# Patient Record
Sex: Female | Born: 1980 | Race: White | Hispanic: No | Marital: Married | Smoking: Never smoker
Health system: Southern US, Community
[De-identification: ages and names within clinical notes are randomized; demographics above are authoritative.]

## PROBLEM LIST (undated history)

## (undated) DIAGNOSIS — Z90710 Acquired absence of both cervix and uterus: Secondary | ICD-10-CM

## (undated) DIAGNOSIS — J302 Other seasonal allergic rhinitis: Secondary | ICD-10-CM

## (undated) DIAGNOSIS — Z803 Family history of malignant neoplasm of breast: Secondary | ICD-10-CM

## (undated) HISTORY — PX: TUBAL LIGATION: SHX77

## (undated) HISTORY — PX: LAPAROSCOPY: SHX197

## (undated) HISTORY — DX: Family history of malignant neoplasm of breast: Z80.3

## (undated) HISTORY — PX: BREAST EXCISIONAL BIOPSY: SUR124

---
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
## 2005-02-27 ENCOUNTER — Emergency Department: Payer: Self-pay | Admitting: Emergency Medicine

## 2005-05-13 ENCOUNTER — Ambulatory Visit: Payer: Self-pay | Admitting: Unknown Physician Specialty

## 2007-01-29 ENCOUNTER — Observation Stay: Payer: Self-pay

## 2007-06-06 ENCOUNTER — Observation Stay: Payer: Self-pay | Admitting: Unknown Physician Specialty

## 2007-06-13 ENCOUNTER — Inpatient Hospital Stay: Payer: Self-pay | Admitting: Unknown Physician Specialty

## 2008-02-17 ENCOUNTER — Emergency Department: Payer: Self-pay | Admitting: Emergency Medicine

## 2009-07-31 ENCOUNTER — Ambulatory Visit: Payer: Self-pay | Admitting: Unknown Physician Specialty

## 2011-12-21 HISTORY — PX: OTHER SURGICAL HISTORY: SHX169

## 2012-03-21 ENCOUNTER — Emergency Department: Payer: Self-pay | Admitting: Emergency Medicine

## 2012-03-21 LAB — CBC
HCT: 43.9 % (ref 35.0–47.0)
MCH: 31.7 pg (ref 26.0–34.0)
MCV: 93 fL (ref 80–100)
Platelet: 175 10*3/uL (ref 150–440)
RDW: 12.4 % (ref 11.5–14.5)
WBC: 9.4 10*3/uL (ref 3.6–11.0)

## 2012-03-21 LAB — COMPREHENSIVE METABOLIC PANEL
Alkaline Phosphatase: 58 U/L (ref 50–136)
Anion Gap: 9 (ref 7–16)
Creatinine: 0.79 mg/dL (ref 0.60–1.30)
EGFR (African American): >60
SGOT(AST): 16 U/L (ref 15–37)
SGPT (ALT): 16 U/L
Sodium: 141 mmol/L (ref 136–145)
Total Protein: 7.9 g/dL (ref 6.4–8.2)

## 2012-03-22 LAB — URINALYSIS, COMPLETE
Bilirubin,UR: NEGATIVE
Blood: NEGATIVE
Leukocyte Esterase: NEGATIVE
Nitrite: NEGATIVE
Ph: 5 (ref 4.5–8.0)
Specific Gravity: 1.027 (ref 1.003–1.030)
Squamous Epithelial: 2
WBC UR: 1 /HPF (ref 0–5)

## 2012-03-22 LAB — PREGNANCY, URINE: Pregnancy Test, Urine: NEGATIVE m[IU]/mL

## 2013-03-12 ENCOUNTER — Ambulatory Visit: Payer: Self-pay | Admitting: Obstetrics and Gynecology

## 2013-03-12 LAB — CBC WITH DIFFERENTIAL/PLATELET
Basophil #: 0 10*3/uL (ref 0.0–0.1)
Eosinophil #: 0 10*3/uL (ref 0.0–0.7)
HGB: 11.5 g/dL — ABNORMAL LOW (ref 12.0–16.0)
Lymphocyte #: 1.4 10*3/uL (ref 1.0–3.6)
Lymphocyte %: 16.1 %
MCH: 28 pg (ref 26.0–34.0)
MCHC: 33.3 g/dL (ref 32.0–36.0)
Monocyte #: 0.8 x10 3/mm (ref 0.2–0.9)
Monocyte %: 8.6 %
Neutrophil #: 6.6 10*3/uL — ABNORMAL HIGH (ref 1.4–6.5)
Neutrophil %: 74.5 %
Platelet: 209 10*3/uL (ref 150–440)
WBC: 8.9 10*3/uL (ref 3.6–11.0)

## 2013-03-13 ENCOUNTER — Inpatient Hospital Stay: Payer: Self-pay | Admitting: Obstetrics and Gynecology

## 2013-03-14 LAB — HEMATOCRIT: HCT: 31.4 % — ABNORMAL LOW (ref 35.0–47.0)

## 2014-01-30 ENCOUNTER — Emergency Department: Payer: Self-pay | Admitting: Internal Medicine

## 2014-01-30 LAB — COMPREHENSIVE METABOLIC PANEL
ALK PHOS: 57 U/L
AST: 11 U/L — AB (ref 15–37)
Albumin: 3.5 g/dL (ref 3.4–5.0)
Anion Gap: 2 — ABNORMAL LOW (ref 7–16)
BUN: 11 mg/dL (ref 7–18)
Bilirubin,Total: 0.8 mg/dL (ref 0.2–1.0)
CO2: 26 mmol/L (ref 21–32)
Calcium, Total: 8.5 mg/dL (ref 8.5–10.1)
Chloride: 108 mmol/L — ABNORMAL HIGH (ref 98–107)
Creatinine: 0.72 mg/dL (ref 0.60–1.30)
EGFR (Non-African Amer.): >60
Glucose: 117 mg/dL — ABNORMAL HIGH (ref 65–99)
OSMOLALITY: 272 (ref 275–301)
Potassium: 3.6 mmol/L (ref 3.5–5.1)
SGPT (ALT): 14 U/L (ref 12–78)
SODIUM: 136 mmol/L (ref 136–145)
Total Protein: 6.9 g/dL (ref 6.4–8.2)

## 2014-01-30 LAB — CBC
HCT: 34.6 % — ABNORMAL LOW (ref 35.0–47.0)
HGB: 11.1 g/dL — ABNORMAL LOW (ref 12.0–16.0)
MCH: 27 pg (ref 26.0–34.0)
MCHC: 32.2 g/dL (ref 32.0–36.0)
MCV: 84 fL (ref 80–100)
Platelet: 198 10*3/uL (ref 150–440)
RBC: 4.12 10*6/uL (ref 3.80–5.20)
RDW: 14.5 % (ref 11.5–14.5)
WBC: 8.1 10*3/uL (ref 3.6–11.0)

## 2014-01-30 LAB — URINALYSIS, COMPLETE
Bilirubin,UR: NEGATIVE
GLUCOSE, UR: NEGATIVE mg/dL (ref 0–75)
KETONE: NEGATIVE
NITRITE: NEGATIVE
Ph: 7 (ref 4.5–8.0)
Protein: 100
Specific Gravity: 1.014 (ref 1.003–1.030)
Squamous Epithelial: 1

## 2014-02-01 LAB — URINE CULTURE

## 2014-12-20 HISTORY — PX: ABDOMINAL HYSTERECTOMY: SHX81

## 2015-04-11 NOTE — Op Note (Signed)
PATIENT NAME:  Katie Blanchard, Katie Blanchard DATE OF BIRTH:  19-Nov-1981  DATE OF PROCEDURE:  03/13/2013  PREOPERATIVE DIAGNOSES: 1.  Intrauterine pregnancy at [redacted] weeks gestational age.  2.  History of prior cesarean section, desires repeat.  3.  Multiparity, desires permanent sterility.   POSTOPERATIVE DIAGNOSES:  1.  Intrauterine pregnancy at [redacted] weeks gestational age.  2.  History of prior cesarean section, desires repeat.  3.  Multiparity, desires permanent sterility.   PROCEDURES: 1.  Repeat low transverse cesarean section via Pfannenstiel incision.  2.  Bilateral tubal ligation via Pomeroy method.  SURGEON: Prentice Docker, MD  ASSISTANT:  Glean Salen, MD   ANESTHESIA:  Spinal.   ESTIMATED BLOOD LOSS: 750 mL.  OPERATIVE FLUIDS: 1200 mL.   COMPLICATIONS: None.   FINDINGS: Normal appearing gravid uterus, fallopian tubes, and ovaries.   SPECIMENS: None.   CONDITION: Stable at the end of the procedure.  INDICATION FOR PROCEDURE:  The patient is a 34 year old gravida 2, para 1-0-0-1 at [redacted] weeks gestational age with a history of prior cesarean section. Furthermore, she desired permanent sterilization. She was counseled regarding alternatives as well as the risks and benefits of permanent sterilization including a failure rate of approximately 4 in 1,000 and the increased risk of ectopic pregnancy should pregnancy occur after undergoing tubal sterilization. Knowing these risks, she desired to proceed with repeat cesarean section and tubal ligation. She was therefore taken to the operating room for abdominal delivery.   PROCEDURE IN DETAIL: After the patient was met in the preoperative area and the procedure was reviewed and the patient's desire for tubal ligation was once again verified, she was taken to the operating room. She was placed under  spinal anesthesia, which was found to be adequate. She was placed in the dorsal supine position with a leftward tilt and prepped and  draped in the usual sterile fashion. A Pfannenstiel incision was made and carried through the various layers until the peritoneum was identified and entered sharply. Peritoneal opening was then extended in the cranial and caudal directions. A bladder flap was created and a bladder blade was placed to pull the bladder out of the operative area of interest. A low transverse hysterotomy was made on the uterus and extended laterally with cranial and caudal tension. The amniotic sac was ruptured for clear fluid and the fetal vertex was grasped and elevated to the hysterotomy and delivered followed by the shoulders and the rest of the body without difficulty. The cord was clamped and cut and the infant was handed off to the awaiting pediatrician. The placenta was then removed and the uterus was exteriorized and cleared of all clots and debris. The hysterotomy was closed using #0 Vicryl in a running locked fashion. A second layer of the same suture was used to obtain hemostasis. Hemostasis also required an additional figure-of-eight stitch.   Attention was turned to the left fallopian tube for the ligation portion of the procedure. An approximately 3 cm segment was ligated at approximately 3 cm from the cornual region using the Pomeroy method by elevating the tubal segment with a Babcock clamp and then using 0 plain gut x 2 sutures to tie off at the base and then remove the tubal segment. Hemostasis was verified. This procedure was carried out in exactly the same manner on the right fallopian tube and hemostasis was also verified.   The uterus was returned to the abdomen and the abdomen was cleared of all clots and debris.  The peritoneum was reapproximated with a loose 0 Vicryl figure-of-eight stitch.   The On-Q pump catheters were placed, both in the midline, approximately 1 cm apart, 1 just cephalad to the first, approximately 4 cm cephalad to the incision line. They were placed at the level just superficial to the  rectus abdominis muscles and just deep to the rectus fascia. They were inserted to the level of approximately the fourth mark on the catheters.   The rest of the insertion was carried out in accordance with the manufacturer's recommendations.   The fascia was closed using #0 Maxon in a running fashion starting at both apices and meeting in the midline where they were tied off. The incision was closed using staples. The On-Q catheter pumps were affixed to the skin using Dermabond as well as Steri-Strips and Tegaderm. Prior to the end of the case, the On-Q pump was bolused with 5 mL of 0.5% Marcaine in each catheter for a total of 10 mL.   The patient tolerated the procedure well. Sponge, lap and needle counts were correct x 2. Prior to the procedure, the patient received 2 grams of Ancef for antibiotic prophylaxis. She was taken to the recovery room in stable condition.  ____________________________ Will Bonnet, MD sdj:sb D: 03/13/2013 08:49:59 ET T: 03/13/2013 09:16:26 ET JOB#: [id number]  cc: Will Bonnet, MD, <Dictator> Will Bonnet MD ELECTRONICALLY SIGNED 04/16/2013 13:17

## 2015-07-01 ENCOUNTER — Encounter: Payer: Self-pay | Admitting: *Deleted

## 2015-07-09 ENCOUNTER — Encounter: Payer: Self-pay | Admitting: General Surgery

## 2015-07-10 ENCOUNTER — Encounter: Payer: Self-pay | Admitting: General Surgery

## 2015-07-10 ENCOUNTER — Ambulatory Visit: Payer: BLUE CROSS/BLUE SHIELD

## 2015-07-10 ENCOUNTER — Ambulatory Visit (INDEPENDENT_AMBULATORY_CARE_PROVIDER_SITE_OTHER): Payer: BLUE CROSS/BLUE SHIELD | Admitting: General Surgery

## 2015-07-10 VITALS — BP 116/70 | HR 76 | Resp 12 | Ht 64.0 in | Wt 117.0 lb

## 2015-07-10 DIAGNOSIS — N6310 Unspecified lump in the right breast, unspecified quadrant: Secondary | ICD-10-CM

## 2015-07-10 DIAGNOSIS — N63 Unspecified lump in breast: Secondary | ICD-10-CM | POA: Diagnosis not present

## 2015-07-10 NOTE — Progress Notes (Addendum)
Patient ID: Katie Blanchard, female   DOB: 1981/10/10, 34 y.o.   MRN: [id number]  Chief Complaint  Patient presents with  . Other    right breast lump    HPI Katie Blanchard is a 34 y.o. female here today for a evlaution of a lump in right breast. Patient states she noticed this about 6 months ago. She states the area has got bigger. No pain but tender to touch.  HPI  No past medical history on file.  Past Surgical History  Procedure Laterality Date  . Laparoscopy  [id number]  . Toe surgery Left 2002  . Breast mass removal Left 2013    Fibroadenoma    Family History  Problem Relation Age of Onset  . Breast cancer Maternal Aunt   . Ovarian cancer Mother     Social History History  Substance Use Topics  . Smoking status: Never Smoker   . Smokeless tobacco: Not on file  . Alcohol Use: 0.0 oz/week    0 Standard drinks or equivalent per week    Allergies  Allergen Reactions  . Prednazoline Anaphylaxis    No current outpatient prescriptions on file.   No current facility-administered medications for this visit.    Review of Systems Review of Systems  Constitutional: Negative.   Respiratory: Negative.   Cardiovascular: Negative.     Blood pressure 116/70, pulse 76, resp. rate 12, height 5\' 4"  (1.626 m), weight 117 lb (53.071 kg), last menstrual period 07/10/2015.  Physical Exam Physical Exam  Constitutional: She is oriented to person, place, and time. She appears well-nourished.  Eyes: Conjunctivae are normal. No scleral icterus.  Neck: Neck supple.  Cardiovascular: Normal rate, regular rhythm and normal heart sounds.   Pulmonary/Chest: Effort normal and breath sounds normal. Right breast exhibits mass (upper inner quadrant). Right breast exhibits no inverted nipple, no nipple discharge, no skin change and no tenderness. Left breast exhibits no inverted nipple, no mass, no nipple discharge, no skin change and no tenderness.    Lymphadenopathy:    She has no  cervical adenopathy.  Neurological: She is alert and oriented to person, place, and time.  Skin: Skin is warm and dry.    Data Reviewed Ultrasound examination of the right breast in the upper inner quadrant showed a 1.0 x 1.5 x 2.0 cm slightly lobulated hypoechoic mass with posterior acoustic enhancement and sharp edge affect. The mass is consistent with a fibroadenoma. BI-RADS-2.  Assessment    The patient reports an enlarging breast mass with mild local discomfort. Excision is warranted.    Plan    The procedure was reviewed. She is undergone surgical excision in the past comfortably as an office procedure.     Patient to return for right breast office excision. This patient requests to call the office back to arrange a date once she checks her work schedule.   Robert Bellow 07/12/2015, 9:12 AM

## 2015-07-10 NOTE — Patient Instructions (Addendum)
Patient to return for right breast office excision. Please call back to arrange a date at your convenience.

## 2015-07-12 DIAGNOSIS — N6310 Unspecified lump in the right breast, unspecified quadrant: Secondary | ICD-10-CM | POA: Insufficient documentation

## 2015-08-26 ENCOUNTER — Ambulatory Visit (INDEPENDENT_AMBULATORY_CARE_PROVIDER_SITE_OTHER): Payer: BLUE CROSS/BLUE SHIELD | Admitting: General Surgery

## 2015-08-26 ENCOUNTER — Encounter: Payer: Self-pay | Admitting: General Surgery

## 2015-08-26 VITALS — BP 112/68 | HR 62 | Resp 12 | Ht 64.0 in | Wt 116.0 lb

## 2015-08-26 DIAGNOSIS — N6310 Unspecified lump in the right breast, unspecified quadrant: Secondary | ICD-10-CM

## 2015-08-26 DIAGNOSIS — N63 Unspecified lump in breast: Secondary | ICD-10-CM

## 2015-08-26 DIAGNOSIS — D241 Benign neoplasm of right breast: Secondary | ICD-10-CM

## 2015-08-26 DIAGNOSIS — D249 Benign neoplasm of unspecified breast: Secondary | ICD-10-CM | POA: Insufficient documentation

## 2015-08-26 HISTORY — PX: BREAST MASS EXCISION: SHX1267

## 2015-08-26 MED ORDER — HYDROCODONE-ACETAMINOPHEN 5-325 MG PO TABS: 1.0000 | ORAL_TABLET | Freq: Four times a day (QID) | ORAL | Status: DC | PRN

## 2015-08-26 NOTE — Progress Notes (Signed)
Patient ID: Katie Blanchard, female   DOB: May 18, 1981, 34 y.o.   MRN: [id number]  Chief Complaint  Patient presents with  . Procedure    right breast mass excision    HPI Katie Blanchard is a 34 y.o. female.  Here today for excision right breast mass.   HPI  No past medical history on file.  Past Surgical History  Procedure Laterality Date  . Laparoscopy  [id number]  . Toe surgery Left 2002  . Breast mass removal Left 2013    Fibroadenoma    Family History  Problem Relation Age of Onset  . Breast cancer Maternal Aunt   . Ovarian cancer Mother     Social History Social History  Substance Use Topics  . Smoking status: Never Smoker   . Smokeless tobacco: None  . Alcohol Use: 0.0 oz/week    0 Standard drinks or equivalent per week    Allergies  Allergen Reactions  . Prednazoline Anaphylaxis    Current Outpatient Prescriptions  Medication Sig Dispense Refill  . HYDROcodone-acetaminophen (NORCO) 5-325 MG per tablet Take 1-2 tablets by mouth every 6 (six) hours as needed for moderate pain or severe pain. 30 tablet 0   No current facility-administered medications for this visit.    Review of Systems Review of Systems  Constitutional: Negative.   Respiratory: Negative.   Cardiovascular: Negative.     Blood pressure 112/68, pulse 62, resp. rate 12, height 5\' 4"  (1.626 m), weight 116 lb (52.617 kg), last menstrual period 08/09/2015.  Physical Exam Physical Exam  Constitutional: She is oriented to person, place, and time. She appears well-developed and well-nourished.  Pulmonary/Chest:    Neurological: She is alert and oriented to person, place, and time.  Skin: Skin is warm and dry.  Psychiatric: Her behavior is normal.      Assessment    Right breast fibroadenoma.    Plan    The procedure for excision was reviewed.  The area was marked and alcohol applied to the skin. 20 mL of 0.5% Xylocaine with 0.25% Marcaine with 1-200,000 units of epinephrine was  utilized well tolerated. Chlor prep was applied to the skin. A curvilinear incision in the upper inner quadrant of the breast over the mass was carried down through skin and subcutaneous this tissue with hemostasis achieved by 3-0 Vicryls suture ligature. The mass was excised and oriented with a short suture medially, long suture superiorly. The breast parenchyma was reapproximated with a 3-0 Vicryls figure-of-eight suture. The skin was closed with a running 3-0 Vicryls septic suture. Benzoin, Steri-Strips, Telfa and Tegaderm dressing applied.  Ice pack provided. Postoperative wound care reviewed with the patient by the nurse.  Follow-up examination in one week. She will be contacted when pathology is available.     PCP:  No Pcp   Robert Bellow 08/26/2015, 1:21 PM

## 2015-08-26 NOTE — Patient Instructions (Signed)

## 2015-08-27 ENCOUNTER — Telehealth: Payer: Self-pay | Admitting: General Surgery

## 2015-08-27 LAB — PATHOLOGY

## 2015-08-27 NOTE — Telephone Encounter (Signed)
The patient was notified that the pathology was as anticipated, fibroadenoma. She reports being sore but otherwise doing well. Follow up next week as planned.

## 2015-09-03 ENCOUNTER — Encounter: Payer: Self-pay | Admitting: General Surgery

## 2015-09-03 ENCOUNTER — Ambulatory Visit (INDEPENDENT_AMBULATORY_CARE_PROVIDER_SITE_OTHER): Payer: BLUE CROSS/BLUE SHIELD | Admitting: General Surgery

## 2015-09-03 VITALS — BP 116/68 | HR 68 | Resp 12 | Ht 64.0 in | Wt 116.0 lb

## 2015-09-03 DIAGNOSIS — D241 Benign neoplasm of right breast: Secondary | ICD-10-CM

## 2015-09-03 NOTE — Patient Instructions (Signed)
Call with any concerns or questions.

## 2015-09-03 NOTE — Progress Notes (Signed)
Patient ID: Katie Blanchard, female   DOB: 01/12/1981, 34 y.o.   MRN: [id number]  Chief Complaint  Patient presents with  . Routine Post Op    HPI Katie Blanchard is a 34 y.o. female.  Here today for postoperative visit, right breast mass excision, fibroadenoma. She states she is doing well.   HPI  No past medical history on file.  Past Surgical History  Procedure Laterality Date  . Laparoscopy  [id number]  . Toe surgery Left 2002  . Breast mass removal Left 2013    Fibroadenoma  . Breast mass excision Right 08-26-15    fibroadenoma    Family History  Problem Relation Age of Onset  . Breast cancer Maternal Aunt   . Ovarian cancer Mother     Social History Social History  Substance Use Topics  . Smoking status: Never Smoker   . Smokeless tobacco: None  . Alcohol Use: 0.0 oz/week    0 Standard drinks or equivalent per week    Allergies  Allergen Reactions  . Prednazoline Anaphylaxis    prednisone    No current outpatient prescriptions on file.   No current facility-administered medications for this visit.    Review of Systems Review of Systems  Constitutional: Negative.   Respiratory: Negative.   Cardiovascular: Negative.     Blood pressure 116/68, pulse 68, resp. rate 12, height 5\' 4"  (1.626 m), weight 116 lb (52.617 kg), last menstrual period 08/29/2015.  Physical Exam Physical Exam  Constitutional: She is oriented to person, place, and time. She appears well-developed and well-nourished.  Eyes: No scleral icterus.  Pulmonary/Chest:    Well healed incision in right breast   Lymphadenopathy:    She has no cervical adenopathy.  Neurological: She is alert and oriented to person, place, and time.  Skin: Skin is warm and dry.  Psychiatric: Her behavior is normal.    Data Reviewed Pathology confirmed the clinical impression of a fibroadenoma.  Assessment    Doing well status post fibroadenoma excision.    Plan    The patient will continue her  own monthly self examination. Screening mammogram at age 89.     Call with any concerns or questions.   PCP:  No Pcp   Robert Bellow 09/04/2015, 7:50 AM

## 2015-10-15 LAB — HM PAP SMEAR: HM Pap smear: NEGATIVE

## 2015-11-26 ENCOUNTER — Encounter
Admission: RE | Admit: 2015-11-26 | Discharge: 2015-11-26 | Disposition: A | Discharge status: Home / Self Care | Payer: BLUE CROSS/BLUE SHIELD | Source: Ambulatory Visit | Attending: Obstetrics and Gynecology | Admitting: Obstetrics and Gynecology

## 2015-11-26 DIAGNOSIS — Z01812 Encounter for preprocedural laboratory examination: Secondary | ICD-10-CM | POA: Insufficient documentation

## 2015-11-26 LAB — TYPE AND SCREEN
ABO/RH(D): O POS
ANTIBODY SCREEN: NEGATIVE

## 2015-11-26 LAB — CBC
HCT: 35.9 % (ref 35.0–47.0)
Hemoglobin: 11.2 g/dL — ABNORMAL LOW (ref 12.0–16.0)
MCH: 25.2 pg — AB (ref 26.0–34.0)
MCHC: 31.2 g/dL — ABNORMAL LOW (ref 32.0–36.0)
MCV: 80.9 fL (ref 80.0–100.0)
PLATELETS: 225 10*3/uL (ref 150–440)
RBC: 4.43 MIL/uL (ref 3.80–5.20)
RDW: 14.6 % — ABNORMAL HIGH (ref 11.5–14.5)
WBC: 3.9 10*3/uL (ref 3.6–11.0)

## 2015-11-26 LAB — COMPREHENSIVE METABOLIC PANEL
ALT: 14 U/L (ref 14–54)
AST: 15 U/L (ref 15–41)
Albumin: 4.4 g/dL (ref 3.5–5.0)
Alkaline Phosphatase: 40 U/L (ref 38–126)
Anion gap: 3 — ABNORMAL LOW (ref 5–15)
BUN: 14 mg/dL (ref 6–20)
CHLORIDE: 109 mmol/L (ref 101–111)
CO2: 27 mmol/L (ref 22–32)
CREATININE: 0.6 mg/dL (ref 0.44–1.00)
Calcium: 9.1 mg/dL (ref 8.9–10.3)
GFR calc non Af Amer: >60 mL/min (ref >60)
Glucose, Bld: 83 mg/dL (ref 65–99)
Potassium: 3.9 mmol/L (ref 3.5–5.1)
SODIUM: 139 mmol/L (ref 135–145)
Total Bilirubin: 0.9 mg/dL (ref 0.3–1.2)
Total Protein: 7.2 g/dL (ref 6.5–8.1)

## 2015-11-26 LAB — ABO/RH: ABO/RH(D): O POS

## 2015-11-26 NOTE — Patient Instructions (Signed)
  Your procedure is scheduled on: 12/02/15 Report to Day Surgery. [facility]OND FLOOR To find out your arrival time please call [phone] between 1PM - 3PM on 12/01/15.  Remember: Instructions that are not followed completely may result in serious medical risk, up to and including death, or upon the discretion of your surgeon and anesthesiologist your surgery may need to be rescheduled.    _X___ 1. Do not eat food or drink liquids after midnight. No gum chewing or hard candies.     __X__ 2. No Alcohol for 24 hours before or after surgery.   ____ 3. Bring all medications with you on the day of surgery if instructed.    __X__ 4. Notify your doctor if there is any change in your medical condition     (cold, fever, infections).     Do not wear jewelry, make-up, hairpins, clips or nail polish.  Do not wear lotions, powders, or perfumes. You may wear deodorant.  Do not shave 48 hours prior to surgery. Men may shave face and neck.  Do not bring valuables to the hospital.    [facility] is not responsible for any belongings or valuables.               Contacts, dentures or bridgework may not be worn into surgery.  Leave your suitcase in the car. After surgery it may be brought to your room.  For patients admitted to the hospital, discharge time is determined by your                treatment team.   Patients discharged the day of surgery will not be allowed to drive home.   Please read over the following fact sheets that you were given:   Surgical Site Infection Prevention   ____ Take these medicines the morning of surgery with A SIP OF WATER:    1. NONE  2.   3.   4.  5.  6.  ____ Fleet Enema (as directed)   __X_ Use CHG Soap as directed  ____ Use inhalers on the day of surgery  ____ Stop metformin 2 days prior to surgery    ____ Take 1/2 of usual insulin dose the night before surgery and none on the morning of surgery.   ____ Stop Coumadin/Plavix/aspirin on    ____ Stop Anti-inflammatories on  ____ Stop supplements until after surgery.    ____ Bring C-Pap to the hospital.

## 2015-12-02 ENCOUNTER — Ambulatory Visit: Payer: BLUE CROSS/BLUE SHIELD | Admitting: Anesthesiology

## 2015-12-02 ENCOUNTER — Observation Stay
Admission: RE | Admit: 2015-12-02 | Discharge: 2015-12-03 | Disposition: A | Discharge status: Home / Self Care | Payer: BLUE CROSS/BLUE SHIELD | Source: Ambulatory Visit | Attending: Obstetrics and Gynecology | Admitting: Obstetrics and Gynecology

## 2015-12-02 ENCOUNTER — Encounter: Payer: Self-pay | Admitting: *Deleted

## 2015-12-02 ENCOUNTER — Encounter
Admission: RE | Disposition: A | Discharge status: Home / Self Care | Payer: Self-pay | Source: Ambulatory Visit | Attending: Obstetrics and Gynecology

## 2015-12-02 DIAGNOSIS — Z803 Family history of malignant neoplasm of breast: Secondary | ICD-10-CM | POA: Diagnosis not present

## 2015-12-02 DIAGNOSIS — R339 Retention of urine, unspecified: Secondary | ICD-10-CM | POA: Diagnosis not present

## 2015-12-02 DIAGNOSIS — Z8041 Family history of malignant neoplasm of ovary: Secondary | ICD-10-CM | POA: Diagnosis not present

## 2015-12-02 DIAGNOSIS — N838 Other noninflammatory disorders of ovary, fallopian tube and broad ligament: Principal | ICD-10-CM | POA: Insufficient documentation

## 2015-12-02 DIAGNOSIS — G8929 Other chronic pain: Secondary | ICD-10-CM | POA: Diagnosis not present

## 2015-12-02 DIAGNOSIS — R102 Pelvic and perineal pain: Secondary | ICD-10-CM | POA: Insufficient documentation

## 2015-12-02 DIAGNOSIS — N809 Endometriosis, unspecified: Secondary | ICD-10-CM | POA: Diagnosis not present

## 2015-12-02 DIAGNOSIS — Z90710 Acquired absence of both cervix and uterus: Secondary | ICD-10-CM

## 2015-12-02 HISTORY — DX: Acquired absence of both cervix and uterus: Z90.710

## 2015-12-02 HISTORY — PX: CYSTOSCOPY: SHX5120

## 2015-12-02 HISTORY — PX: LAPAROSCOPIC HYSTERECTOMY: SHX1926

## 2015-12-02 SURGERY — HYSTERECTOMY, TOTAL, LAPAROSCOPIC
Anesthesia: General

## 2015-12-02 MED ORDER — ONDANSETRON HCL 4 MG/2ML IJ SOLN: 4.0000 mg | Freq: Once | INTRAMUSCULAR | Status: DC | PRN

## 2015-12-02 MED ORDER — MIDAZOLAM HCL 2 MG/2ML IJ SOLN
INTRAMUSCULAR | Status: DC | PRN
  Administered 2015-12-02: 2 mg via INTRAVENOUS

## 2015-12-02 MED ORDER — FAMOTIDINE 20 MG PO TABS
ORAL_TABLET | ORAL | Status: AC
  Filled 2015-12-02: qty 1

## 2015-12-02 MED ORDER — CEFAZOLIN SODIUM-DEXTROSE 2-3 GM-% IV SOLR
INTRAVENOUS | Status: AC
  Filled 2015-12-02: qty 50

## 2015-12-02 MED ORDER — MENTHOL 3 MG MT LOZG: 1.0000 | LOZENGE | OROMUCOSAL | Status: DC | PRN

## 2015-12-02 MED ORDER — LACTATED RINGERS IV SOLN: INTRAVENOUS | Status: DC

## 2015-12-02 MED ORDER — DEXAMETHASONE SODIUM PHOSPHATE 4 MG/ML IJ SOLN
INTRAMUSCULAR | Status: DC | PRN
  Administered 2015-12-02: 5 mg via INTRAVENOUS

## 2015-12-02 MED ORDER — ACETAMINOPHEN 10 MG/ML IV SOLN
1000.0000 mg | Freq: Four times a day (QID) | INTRAVENOUS | Status: DC
  Administered 2015-12-02 – 2015-12-03 (×2): 1000 mg via INTRAVENOUS
  Filled 2015-12-02 (×4): qty 100

## 2015-12-02 MED ORDER — ACETAMINOPHEN 10 MG/ML IV SOLN
INTRAVENOUS | Status: DC | PRN
  Administered 2015-12-02: 1000 mg via INTRAVENOUS

## 2015-12-02 MED ORDER — ONDANSETRON HCL 4 MG/2ML IJ SOLN
INTRAMUSCULAR | Status: DC | PRN
  Administered 2015-12-02: 4 mg via INTRAVENOUS

## 2015-12-02 MED ORDER — LACTATED RINGERS IV SOLN
INTRAVENOUS | Status: DC
  Administered 2015-12-02 – 2015-12-03 (×2): via INTRAVENOUS

## 2015-12-02 MED ORDER — ONDANSETRON HCL 4 MG/2ML IJ SOLN
4.0000 mg | Freq: Four times a day (QID) | INTRAMUSCULAR | Status: DC | PRN
  Administered 2015-12-02: 4 mg via INTRAVENOUS
  Filled 2015-12-02: qty 2

## 2015-12-02 MED ORDER — FENTANYL CITRATE (PF) 100 MCG/2ML IJ SOLN
INTRAMUSCULAR | Status: AC
  Filled 2015-12-02: qty 2

## 2015-12-02 MED ORDER — BUPIVACAINE HCL 0.5 % IJ SOLN
INTRAMUSCULAR | Status: DC | PRN
  Administered 2015-12-02: 5 mL

## 2015-12-02 MED ORDER — ROCURONIUM BROMIDE 100 MG/10ML IV SOLN
INTRAVENOUS | Status: DC | PRN
  Administered 2015-12-02: 20 mg via INTRAVENOUS
  Administered 2015-12-02: 50 mg via INTRAVENOUS

## 2015-12-02 MED ORDER — PROMETHAZINE HCL 25 MG/ML IJ SOLN
25.0000 mg | Freq: Four times a day (QID) | INTRAMUSCULAR | Status: DC | PRN
  Administered 2015-12-02: 25 mg via INTRAVENOUS
  Filled 2015-12-02: qty 1

## 2015-12-02 MED ORDER — OXYCODONE-ACETAMINOPHEN 5-325 MG PO TABS: 1.0000 | ORAL_TABLET | ORAL | Status: DC | PRN

## 2015-12-02 MED ORDER — HYDROMORPHONE HCL 1 MG/ML IJ SOLN
INTRAMUSCULAR | Status: AC
  Filled 2015-12-02: qty 1

## 2015-12-02 MED ORDER — SIMETHICONE 80 MG PO CHEW
80.0000 mg | CHEWABLE_TABLET | Freq: Four times a day (QID) | ORAL | Status: DC | PRN
  Administered 2015-12-03: 80 mg via ORAL
  Filled 2015-12-02: qty 1

## 2015-12-02 MED ORDER — ONDANSETRON HCL 4 MG PO TABS: 4.0000 mg | ORAL_TABLET | Freq: Four times a day (QID) | ORAL | Status: DC | PRN

## 2015-12-02 MED ORDER — FAMOTIDINE 20 MG PO TABS
20.0000 mg | ORAL_TABLET | Freq: Once | ORAL | Status: AC
  Administered 2015-12-02: 20 mg via ORAL

## 2015-12-02 MED ORDER — HYDROMORPHONE HCL 1 MG/ML IJ SOLN
1.0000 mg | INTRAMUSCULAR | Status: DC | PRN
  Administered 2015-12-02 (×2): 1 mg via INTRAVENOUS
  Filled 2015-12-02 (×2): qty 1

## 2015-12-02 MED ORDER — IBUPROFEN 600 MG PO TABS
600.0000 mg | ORAL_TABLET | Freq: Four times a day (QID) | ORAL | Status: DC | PRN
  Administered 2015-12-03: 600 mg via ORAL
  Filled 2015-12-02: qty 1

## 2015-12-02 MED ORDER — ACETAMINOPHEN 10 MG/ML IV SOLN
INTRAVENOUS | Status: AC
  Filled 2015-12-02: qty 100

## 2015-12-02 MED ORDER — FENTANYL CITRATE (PF) 100 MCG/2ML IJ SOLN
INTRAMUSCULAR | Status: DC | PRN
  Administered 2015-12-02 (×2): 100 ug via INTRAVENOUS
  Administered 2015-12-02: 150 ug via INTRAVENOUS

## 2015-12-02 MED ORDER — LIDOCAINE HCL (CARDIAC) 20 MG/ML IV SOLN
INTRAVENOUS | Status: DC | PRN
  Administered 2015-12-02: 100 mg via INTRAVENOUS

## 2015-12-02 MED ORDER — MORPHINE SULFATE (PF) 2 MG/ML IV SOLN: 2.0000 mg | INTRAVENOUS | Status: DC | PRN

## 2015-12-02 MED ORDER — NEOSTIGMINE METHYLSULFATE 10 MG/10ML IV SOLN
INTRAVENOUS | Status: DC | PRN
  Administered 2015-12-02: 3 mg via INTRAVENOUS

## 2015-12-02 MED ORDER — CEFAZOLIN SODIUM-DEXTROSE 2-3 GM-% IV SOLR
2.0000 g | INTRAVENOUS | Status: AC
  Administered 2015-12-02: 2 g via INTRAVENOUS

## 2015-12-02 MED ORDER — SCOPOLAMINE 1 MG/3DAYS TD PT72
1.0000 | MEDICATED_PATCH | TRANSDERMAL | Status: DC
  Administered 2015-12-02: 1.5 mg via TRANSDERMAL
  Filled 2015-12-02: qty 1

## 2015-12-02 MED ORDER — DOCUSATE SODIUM 100 MG PO CAPS
100.0000 mg | ORAL_CAPSULE | Freq: Two times a day (BID) | ORAL | Status: DC
  Administered 2015-12-03: 100 mg via ORAL
  Filled 2015-12-02: qty 1

## 2015-12-02 MED ORDER — GLYCOPYRROLATE 0.2 MG/ML IJ SOLN
INTRAMUSCULAR | Status: DC | PRN
  Administered 2015-12-02: 0.4 mg via INTRAVENOUS

## 2015-12-02 MED ORDER — LACTATED RINGERS IV SOLN
INTRAVENOUS | Status: DC
  Administered 2015-12-02 (×2): via INTRAVENOUS

## 2015-12-02 MED ORDER — PROPOFOL 10 MG/ML IV BOLUS
INTRAVENOUS | Status: DC | PRN
Start: 2015-12-02 — End: 2015-12-02
  Administered 2015-12-02: 150 mg via INTRAVENOUS

## 2015-12-02 MED ORDER — BUPIVACAINE HCL (PF) 0.5 % IJ SOLN
INTRAMUSCULAR | Status: AC
  Filled 2015-12-02: qty 30

## 2015-12-02 MED ORDER — FENTANYL CITRATE (PF) 100 MCG/2ML IJ SOLN
25.0000 ug | INTRAMUSCULAR | Status: DC | PRN
  Administered 2015-12-02 (×4): 25 ug via INTRAVENOUS

## 2015-12-02 SURGICAL SUPPLY — 55 items
BAG URO DRAIN 2000ML W/SPOUT (MISCELLANEOUS) ×3 IMPLANT
BLADE SURG SZ11 CARB STEEL (BLADE) ×3 IMPLANT
CATH FOLEY 2WAY  5CC 16FR (CATHETERS) ×2
CATH ROBINSON RED A/P 16FR (CATHETERS) ×3 IMPLANT
CATH URTH 16FR FL 2W BLN LF (CATHETERS) ×1 IMPLANT
CHLORAPREP W/TINT 26ML (MISCELLANEOUS) ×3 IMPLANT
CORD MONOPOLAR M/FML 12FT (MISCELLANEOUS) ×3 IMPLANT
DEFOGGER SCOPE WARMER CLEARIFY (MISCELLANEOUS) ×3 IMPLANT
DEVICE SUTURE ENDOST 10MM (ENDOMECHANICALS) ×3 IMPLANT
DEVICE TROCAR PUNCTURE CLOSURE (ENDOMECHANICALS) ×3 IMPLANT
DRAPE LEGGINS SURG 28X43 STRL (DRAPES) ×3 IMPLANT
DRAPE SHEET LG 3/4 BI-LAMINATE (DRAPES) ×3 IMPLANT
DRAPE UNDER BUTTOCK W/FLU (DRAPES) ×3 IMPLANT
ENDOSTITCH 0 SINGLE 48 (SUTURE) IMPLANT
GLOVE BIO SURGEON STRL SZ7 (GLOVE) ×12 IMPLANT
GLOVE BIOGEL PI IND STRL 7.5 (GLOVE) ×1 IMPLANT
GLOVE BIOGEL PI INDICATOR 7.5 (GLOVE) ×2
GLOVE INDICATOR 7.5 STRL GRN (GLOVE) ×12 IMPLANT
GOWN STRL REUS W/ TWL LRG LVL3 (GOWN DISPOSABLE) ×3 IMPLANT
GOWN STRL REUS W/ TWL XL LVL3 (GOWN DISPOSABLE) ×1 IMPLANT
GOWN STRL REUS W/TWL LRG LVL3 (GOWN DISPOSABLE) ×6
GOWN STRL REUS W/TWL XL LVL3 (GOWN DISPOSABLE) ×2
GRASPER SUT TROCAR 14GX15 (MISCELLANEOUS) ×3 IMPLANT
IRRIGATION STRYKERFLOW (MISCELLANEOUS) ×1 IMPLANT
IRRIGATOR STRYKERFLOW (MISCELLANEOUS) ×3
IV LACTATED RINGERS 1000ML (IV SOLUTION) ×6 IMPLANT
KIT RM TURNOVER CYSTO AR (KITS) ×3 IMPLANT
LABEL OR SOLS (LABEL) IMPLANT
LIGASURE BLUNT 5MM 37CM (INSTRUMENTS) ×3 IMPLANT
LIQUID BAND (GAUZE/BANDAGES/DRESSINGS) ×3 IMPLANT
MANIPULATOR VCARE LG CRV RETR (MISCELLANEOUS) IMPLANT
MANIPULATOR VCARE STD CRV RETR (MISCELLANEOUS) ×3 IMPLANT
NDL SAFETY 22GX1.5 (NEEDLE) ×3 IMPLANT
OCCLUDER COLPOPNEUMO (BALLOONS) ×3 IMPLANT
PACK LAP CHOLECYSTECTOMY (MISCELLANEOUS) ×3 IMPLANT
PAD OB MATERNITY 4.3X12.25 (PERSONAL CARE ITEMS) ×3 IMPLANT
PAD PREP 24X41 OB/GYN DISP (PERSONAL CARE ITEMS) ×3 IMPLANT
SCISSORS METZENBAUM CVD 33 (INSTRUMENTS) ×6 IMPLANT
SET CYSTO W/LG BORE CLAMP LF (SET/KITS/TRAYS/PACK) ×3 IMPLANT
SLEEVE ENDOPATH XCEL 5M (ENDOMECHANICALS) ×3 IMPLANT
SOL PREP PVP 2OZ (MISCELLANEOUS)
SOLUTION PREP PVP 2OZ (MISCELLANEOUS) IMPLANT
SPONGE LAP 18X18 5 PK (GAUZE/BANDAGES/DRESSINGS) IMPLANT
SPONGE XRAY 4X4 16PLY STRL (MISCELLANEOUS) IMPLANT
SURGILUBE 2OZ TUBE FLIPTOP (MISCELLANEOUS) IMPLANT
SUT ENDO VLOC 180-0-8IN (SUTURE) ×3 IMPLANT
SUT VIC AB 0 CT1 36 (SUTURE) ×3 IMPLANT
SUT VIC AB 3-0 SH 27 (SUTURE) ×2
SUT VIC AB 3-0 SH 27X BRD (SUTURE) ×1 IMPLANT
SYR 50ML LL SCALE MARK (SYRINGE) ×3 IMPLANT
SYRINGE 10CC LL (SYRINGE) ×6 IMPLANT
TROCAR ENDO BLADELESS 11MM (ENDOMECHANICALS) ×3 IMPLANT
TROCAR XCEL NON-BLD 5MMX100MML (ENDOMECHANICALS) ×3 IMPLANT
TUBING INSUFFLATOR HEATED (MISCELLANEOUS) ×3 IMPLANT
WATER STERILE IRR 3000ML UROMA (IV SOLUTION) IMPLANT

## 2015-12-02 NOTE — H&P (Signed)
History and Physical Interval Note:  Katie Blanchard  has presented today for surgery, with the diagnosis of ENDOMETRIOSIS  The various methods of treatment have been discussed with the patient and family. After consideration of risks, benefits and other options for treatment, the patient has consented to  Procedure(s): HYSTERECTOMY TOTAL LAPAROSCOPIC/BILATERAL SALPINGECTOMY (N/A) CYSTOSCOPY (N/A) as a surgical intervention .  The patient's history has been reviewed, patient examined, no change in status, stable for surgery.  I have reviewed the patient's chart and labs.  Questions were answered to the patient's satisfaction.    The patient does not take a beta blocker and one is not indicated for this surgery.   Will Bonnet, MD 12/02/2015 9:38 AM

## 2015-12-02 NOTE — Progress Notes (Signed)
Patient not tolerating PO well. She continues to be nauseated after Zofran and Phenergan. Will discontinue dilaudid. Will give her Tylenol through her IV every six hours and will add morphine IV as needed. She has not yet voided. Will give her another hour to void otherwise will give her catheter.

## 2015-12-02 NOTE — Transfer of Care (Signed)
Immediate Anesthesia Transfer of Care Note  Patient: Katie Blanchard  Procedure(s) Performed: Procedure(s): HYSTERECTOMY TOTAL LAPAROSCOPIC/BILATERAL SALPINGECTOMY (N/A) CYSTOSCOPY (N/A)  Patient Location: PACU  Anesthesia Type:General  Level of Consciousness: sedated and patient cooperative  Airway & Oxygen Therapy: Patient Spontanous Breathing and Patient connected to nasal cannula oxygen  Post-op Assessment: Report given to RN and Post -op Vital signs reviewed and stable  Post vital signs: Reviewed and stable  Last Vitals:  Filed Vitals:   12/02/15 0813  BP: 111/67  Pulse: 90  Temp: 36.4 C  Resp: 16    Complications: No apparent anesthesia complications

## 2015-12-02 NOTE — Anesthesia Preprocedure Evaluation (Signed)
Anesthesia Evaluation  Patient identified by MRN, date of birth, ID band Patient awake    Reviewed: Allergy & Precautions, NPO status , Patient's Chart, lab work & pertinent test results  Airway Mallampati: I       Dental no notable dental hx.    Pulmonary neg pulmonary ROS,    Pulmonary exam normal        Cardiovascular negative cardio ROS Normal cardiovascular exam     Neuro/Psych    GI/Hepatic negative GI ROS, Neg liver ROS,   Endo/Other  negative endocrine ROS  Renal/GU negative Renal ROS     Musculoskeletal negative musculoskeletal ROS (+)   Abdominal Normal abdominal exam  (+)   Peds negative pediatric ROS (+)  Hematology negative hematology ROS (+)   Anesthesia Other Findings   Reproductive/Obstetrics                             Anesthesia Physical Anesthesia Plan  ASA: II  Anesthesia Plan: General   Post-op Pain Management:    Induction: Intravenous  Airway Management Planned: Oral ETT  Additional Equipment:   Intra-op Plan:   Post-operative Plan: Extubation in OR  Informed Consent: I have reviewed the patients History and Physical, chart, labs and discussed the procedure including the risks, benefits and alternatives for the proposed anesthesia with the patient or authorized representative who has indicated his/her understanding and acceptance.     Plan Discussed with: CRNA  Anesthesia Plan Comments:         Anesthesia Quick Evaluation

## 2015-12-02 NOTE — Anesthesia Procedure Notes (Signed)
Procedure Name: Intubation Date/Time: 12/02/2015 10:38 AM Performed by: Rosaria Ferries, Aric Jost Pre-anesthesia Checklist: Patient identified, Emergency Drugs available, Suction available and Patient being monitored Patient Re-evaluated:Patient Re-evaluated prior to inductionOxygen Delivery Method: Circle system utilized Preoxygenation: Pre-oxygenation with 100% oxygen Intubation Type: IV induction Laryngoscope Size: Mac and 3 Grade View: Grade I Tube size: 7.0 mm Number of attempts: 1 Placement Confirmation: ETT inserted through vocal cords under direct vision,  positive ETCO2 and CO2 detector Secured at: 21 cm Tube secured with: Tape Dental Injury: Teeth and Oropharynx as per pre-operative assessment

## 2015-12-02 NOTE — Op Note (Signed)
Op Note Total Laparoscopic Hysterectomy  Pre-Op Diagnosis:  1) Chronic Pelvic Pain in female 2) Endometriosis on diagnostic laparoscopy  Post-Op Diagnosis:  1) Chronic Pelvic Pain in female 2) Endometriosis on diagnostic laparoscopy  Procedures:  1. Total laparoscopic hysterectomy 2. Bilateral salpingectomy 3. Cystoscopy  Primary Surgeon: Dr. Prentice Docker   Assistant Surgeon: Larey Days, MD  EBL: 100 ml   IVF: 1,200 mL   Urine output: 700 mL  Specimens:  1) Uterus with cervix 2) bilateral fallopian tubes  Drains: None  Complications: None   Disposition: PACU   Condition: Stable   Findings:  1) dense adhesions of anterior abdominal and pelvic wall to the anterior uterus 2) slightly enlarged and globular uterus 3) normal-appearing fallopian tubes with normal post tubal ligation changes 4) normal-appearing ovaries 5) no evidence of bladder injury and evidence of reflux from bilateral ureteral orifices on cystoscopy  Procedure Summary:   The patient was taken to the operating room where general anesthesia was administered and found to be adequate. She was placed in the dorsal supine lithotomy position in [place] stirrups and prepped and draped in usual sterile fashion. After a timeout was called, an indwelling catheter was placed in her bladder. A sterile speculum was placed in the vagina and a single-tooth tenaculum was used to grasp the anterior lip of the cervix. An V-Care uterine manipulator was affixed to the uterus in accordance to the manufacturers recommendations. The speculum and tenaculum was removed from the vagina.  Attention was turned to the abdomen where, after injection of local anesthetic, a 5 mm infraumbilical incision was made with the scalpel. Entry into the abdomen was obtained via Optiview trocar technique (a blunt entry technique with camera visualization through the obturator upon entry). Verification of entry into the abdomen was obtained  using opening pressures. The abdomen was insufflated with CO2. The camera was introduced through the trocar with verification of atraumatic entry. A left lower quadrant 5 mm port was created via direct intra-abdominal camera visualization without difficulty. An 11 mm right lower quadrant port was placed in a similar fashion without difficulty.  After inspection of the abdomen and pelvis with the above-noted findings, the bilateral ureters were identified and found to be well away from the operative area of interest. The left fallopian tube was grasped at the fimbriated end and was transected using the LigaSure along the mesosalpinx in a lateral to medial fashion. The LigaSure then was used to transect the left round ligament and the utero-ovarian ligament was transected. Tissue was divided along the left broad ligament to the level of the anterior cervical os. Adhesions from the anterior uterus were taken down with great care and the lower uterine segment was identified. Scar tissue and bladder tissue were dissected off the lower uterine segment and cervix without difficulty. The left uterine artery was skeletonized and identified and after ligation was transected with the LigaSure device. The same procedure was carried out on the right side. The colpotomy was performed using monopolar electrocautery in a circumferential fashion following the KOH ring.  The vaginal occluder balloon was insufflated prior to colpotomy to prevent loss of pneumoperitoneum. The uterus and fallopian tubes and cervix were removed through the vagina.  Cystoscopy was undertaken at this point. The Foley catheter was removed and the 70 cystoscope was gently introduced through the urethra. The bladder survey was undertaken with efflux of urine from both orifices noted. There were no defects noted in the bladder wall. The cystoscope was removed and the  Foley catheter was replaced.  Attention was returned to the pelvis and closure of the  vaginal cuff was undertaken using the V-lock stitch in a running fashion. A gloved hand was placed in the vagina to assess adequate closure of the vaginal cuff. This was found to be satisfactory. All vascular pedicles were inspected and found to be hemostatic. Copious irrigation was undertaken and hemostasis was again verified. The right lower quadrant trocar was removed and the fascia was reapproximated using #0 Vicryl with a single stitch. The abdomen was then desufflated of CO2 after removal of all instruments. Five deep breaths were given by anesthesia to the patient to help with removal of CO2 from the abdomen. The right lower quadrant skin incision was closed using 4-0 Vicryl in a subcuticular fashion. The remaining skin incisions were closed using surgical skin glue and a layer of surgical skin glue was placed over the right lower quadrant skin incision, as well. The catheter was then removed from the bladder. The vagina was inspected and found to be free of instrumentation and sponges.  Sponge, lap, needle, and instrument counts were correct x 2.  VTE prophylaxis pneumatic compression stockings were in place throughout the entire procedure. Antibiotic prophylaxis Ancef 2 g given within 1 hour of skin incision.The patient was then awakened and taken to the recovery room in stable condition.   Prentice Docker, MD 12/02/2015 1:25 PM

## 2015-12-02 NOTE — Anesthesia Postprocedure Evaluation (Signed)
Anesthesia Post Note  Patient: Katie Blanchard  Procedure(s) Performed: Procedure(s) (LRB): HYSTERECTOMY TOTAL LAPAROSCOPIC/BILATERAL SALPINGECTOMY (N/A) CYSTOSCOPY (N/A)  Patient location during evaluation: PACU Anesthesia Type: General Level of consciousness: awake and alert Pain management: pain level controlled Vital Signs Assessment: post-procedure vital signs reviewed and stable Respiratory status: spontaneous breathing and respiratory function stable Cardiovascular status: blood pressure returned to baseline and stable Anesthetic complications: no    Last Vitals:  Filed Vitals:   12/02/15 1427 12/02/15 1524  BP: 102/54 101/54  Pulse: 63 63  Temp: 36.6 C 36.3 C  Resp: 16 16    Last Pain:  Filed Vitals:   12/02/15 1524  PainSc: 2                  KEPHART,WILLIAM K

## 2015-12-03 DIAGNOSIS — N838 Other noninflammatory disorders of ovary, fallopian tube and broad ligament: Secondary | ICD-10-CM | POA: Diagnosis not present

## 2015-12-03 LAB — CBC
HCT: 29.2 % — ABNORMAL LOW (ref 35.0–47.0)
Hemoglobin: 9.5 g/dL — ABNORMAL LOW (ref 12.0–16.0)
MCH: 25.7 pg — AB (ref 26.0–34.0)
MCHC: 32.3 g/dL (ref 32.0–36.0)
MCV: 79.6 fL — ABNORMAL LOW (ref 80.0–100.0)
PLATELETS: 170 10*3/uL (ref 150–440)
RBC: 3.67 MIL/uL — ABNORMAL LOW (ref 3.80–5.20)
RDW: 14.3 % (ref 11.5–14.5)
WBC: 7.4 10*3/uL (ref 3.6–11.0)

## 2015-12-03 LAB — BASIC METABOLIC PANEL
Anion gap: 5 (ref 5–15)
BUN: 12 mg/dL (ref 6–20)
CALCIUM: 8.4 mg/dL — AB (ref 8.9–10.3)
CHLORIDE: 107 mmol/L (ref 101–111)
CO2: 26 mmol/L (ref 22–32)
CREATININE: 0.63 mg/dL (ref 0.44–1.00)
Glucose, Bld: 100 mg/dL — ABNORMAL HIGH (ref 65–99)
Potassium: 4.2 mmol/L (ref 3.5–5.1)
SODIUM: 138 mmol/L (ref 135–145)

## 2015-12-03 LAB — SURGICAL PATHOLOGY

## 2015-12-03 MED ORDER — ONDANSETRON HCL 4 MG PO TABS: 4.0000 mg | ORAL_TABLET | Freq: Four times a day (QID) | ORAL | Status: DC | PRN

## 2015-12-03 MED ORDER — HYDROCODONE-ACETAMINOPHEN 5-325 MG PO TABS: 2.0000 | ORAL_TABLET | Freq: Four times a day (QID) | ORAL | Status: DC | PRN

## 2015-12-03 MED ORDER — IBUPROFEN 600 MG PO TABS: 600.0000 mg | ORAL_TABLET | Freq: Four times a day (QID) | ORAL | Status: DC | PRN

## 2015-12-03 NOTE — Discharge Summary (Signed)
DC Summary Discharge Summary   Patient ID: Katie Blanchard [id number] 34 y.o. 09/04/1981  Admit date: 12/02/2015  Discharge date: 12/03/2015  Principal Diagnoses:  Patient Active Problem List   Diagnosis Date Noted  . Chronic pelvic pain in female 12/02/2015  . Status post laparoscopic hysterectomy 12/02/2015   Secondary Diagnoses:  None  Procedures performed during the hospitalization:  1) Total laparoscopic hysterectomy, bilateral salpingectomy 2) Cystoscopy  HPI: 34 y.o. female admitted for schedule surgery as noted above for endometriosis and chronic pelvic pain.  She H&P for details of HPI.  Past Medical History  Diagnosis Date  . Status post laparoscopic hysterectomy 12/02/2015    Past Surgical History  Procedure Laterality Date  . Laparoscopy  [id number]  . Toe surgery Left 2002  . Breast mass removal Left 2013    Fibroadenoma  . Breast mass excision Right 08-26-15    fibroadenoma  . Cesarean section      x2  . Tubal ligation    . Laparoscopic hysterectomy N/A 12/02/2015    Procedure: HYSTERECTOMY TOTAL LAPAROSCOPIC/BILATERAL SALPINGECTOMY;  Surgeon: Will Bonnet, MD;  Location: [facility] ORS;  Service: Gynecology;  Laterality: N/A;  . Cystoscopy N/A 12/02/2015    Procedure: CYSTOSCOPY;  Surgeon: Will Bonnet, MD;  Location: [facility] ORS;  Service: Gynecology;  Laterality: N/A;    Allergies  Allergen Reactions  . Prednazoline Anaphylaxis    prednisone    Social History  Substance Use Topics  . Smoking status: Never Smoker   . Smokeless tobacco: Never Used  . Alcohol Use: No    Family History  Problem Relation Age of Onset  . Breast cancer Maternal Aunt   . Ovarian cancer Mother     Hospital Course:  Admitted for the above surgery, which occurred without incident. In the postoperative period she initially struggled with nausea, which resolved after discontinuing dilaudid.  She also had difficulty voiding and need a catheter placed overnight after  voiding 759mL when catheterized.  Her catheter was removed on POD#1 and she was able to void twice about two hours after removal of her cathter (first time) and two hours later, each void about 200 mL.  She was tolerating PO, ambulating adequately, voiding spontaneously, and had excellent pain control on minimal medications. Her vital signs were stable and all of her lab work was within the expected values.   Discharge Exam: BP 96/58 mmHg  Pulse 80  Temp(Src) 98.4 F (36.9 C) (Oral)  Resp 20  Ht 5\' 4"  (1.626 m)  Wt 117 lb (53.071 kg)  BMI 20.07 kg/m2  SpO2 100%  LMP 11/19/2015 General  no apparent distress   CV  RRR   Pulmonary  clear to ausculatation bllaterally   Abdomen  Bowel sounds: present  Incisions: clean, dry, intact at all port sites  Extremities  no edema, symmetric, SCDs in place    Condition at Discharge: Stable  Complications affecting treatment: None  Discharge Medications:    Medication List    TAKE these medications        HYDROcodone-acetaminophen 5-325 MG tablet  Commonly known as:  NORCO  Take 2 tablets by mouth every 6 (six) hours as needed for moderate pain or severe pain.     ibuprofen 600 MG tablet  Commonly known as:  ADVIL,MOTRIN  Take 1 tablet (600 mg total) by mouth every 6 (six) hours as needed (mild pain).     ondansetron 4 MG tablet  Commonly known as:  ZOFRAN  Take 1 tablet (  4 mg total) by mouth every 6 (six) hours as needed for nausea.        Follow-up Information    Follow up with Will Bonnet, MD. Schedule an appointment as soon as possible for a visit in 1 week.   Specialty:  Obstetrics and Gynecology   Why:  For wound re-check   Contact information:   [address] [place] [address] [phone]       Discharge Disposition: Home in stable condition  Signed: Will Bonnet, MD 12/03/2015 2:50 PM

## 2015-12-03 NOTE — Discharge Instructions (Signed)
Total Laparoscopic Hysterectomy, Care After °Refer to this sheet in the next few weeks. These instructions provide you with information on caring for yourself after your procedure. Your health care provider may also give you more specific instructions. Your treatment has been planned according to current medical practices, but problems sometimes occur. Call your health care provider if you have any problems or questions after your procedure. °WHAT TO EXPECT AFTER THE PROCEDURE °· Pain and bruising at the incision sites. You will be given pain medicine to control it. °· Menopausal symptoms such as hot flashes, night sweats, and insomnia if your ovaries were removed. °· Sore throat from the breathing tube that was inserted during surgery. °HOME CARE INSTRUCTIONS °· Only take over-the-counter or prescription medicines for pain, discomfort, or fever as directed by your health care provider.   °· Do not take aspirin. It can cause bleeding.   °· Do not drive when taking pain medicine.   °· Follow your health care provider's advice regarding diet, exercise, lifting, driving, and general activities.   °· Resume your usual diet as directed and allowed.   °· Get plenty of rest and sleep.   °· Do not douche, use tampons, or have sexual intercourse for at least 6 weeks, or until your health care provider gives you permission.   °· Change your bandages (dressings) as directed by your health care provider.   °· Monitor your temperature and notify your health care provider of a fever.   °· Take showers instead of baths for 2-3 weeks.   °· Do not drink alcohol until your health care provider gives you permission.   °· If you develop constipation, you may take a mild laxative with your health care provider's permission. Bran foods may help with constipation problems. Drinking enough fluids to keep your urine clear or pale yellow may help as well.   °· Try to have someone home with you for 1-2 weeks to help around the house.    °· Keep all of your follow-up appointments as directed by your health care provider.   °SEEK MEDICAL CARE IF: °· You have swelling, redness, or increasing pain around your incision sites.   °· You have pus coming from your incision.   °· You notice a bad smell coming from your incision.   °· Your incision breaks open.   °· You feel dizzy or lightheaded.   °· You have pain or bleeding when you urinate.   °· You have persistent diarrhea.   °· You have persistent nausea and vomiting.   °· You have abnormal vaginal discharge.   °· You have a rash.   °· You have any type of abnormal reaction or develop an allergy to your medicine.   °· You have poor pain control with your prescribed medicine.   °SEEK IMMEDIATE MEDICAL CARE IF: °· You have chest pain or shortness of breath. °· You have severe abdominal pain that is not relieved with pain medicine. °· You have pain or swelling in your legs. °MAKE SURE YOU: °· Understand these instructions. °· Will watch your condition. °· Will get help right away if you are not doing well or get worse. °  °This information is not intended to replace advice given to you by your health care provider. Make sure you discuss any questions you have with your health care provider. °  °Document Released: 09/26/2013 Document Revised: 12/11/2013 Document Reviewed: 09/26/2013 °Elsevier Interactive Patient Education ©2016 [employer] ° °

## 2015-12-03 NOTE — Progress Notes (Signed)
Patient understands all discharge instructions and the need to make follow up appointments. Patient discharge via wheelchair with auxillary. 

## 2015-12-03 NOTE — Progress Notes (Signed)
1 Day Post-Op Procedure(s) (LRB): HYSTERECTOMY TOTAL LAPAROSCOPIC/BILATERAL SALPINGECTOMY (N/A) CYSTOSCOPY (N/A)  Subjective: Patient reports nausea overnight, though has resolved now.  She has ambulated a few time to attempt to void. She was unable to void yesterday and had a catheter placed with return of 751mL of clear urine.  She has only had a small amount of PO intake so far.  Her pain is controlled well now with only IV acetaminophen.  She has tried nothing by mouth for pain.  She denies chest pain, shortness of breath.      Objective: I have reviewed patient's vital signs, intake and output, medications and labs. BP 91/49 mmHg  Pulse 68  Temp(Src) 99.3 F (37.4 C) (Oral)  Resp 20  Ht 5\' 4"  (1.626 m)  Wt 117 lb (53.071 kg)  BMI 20.07 kg/m2  SpO2 100%  LMP 11/19/2015  General: alert, cooperative and no distress Resp: clear to auscultation bilaterally Cardio: regular rate and rhythm GI: soft, non-tender; bowel sounds normal; no masses,  no organomegaly and incision: clean, dry and intact Extremities: extremities normal, atraumatic, no cyanosis or edema  Assessment: s/p Procedure(s): HYSTERECTOMY TOTAL LAPAROSCOPIC/BILATERAL SALPINGECTOMY (N/A) CYSTOSCOPY (N/A): progressing well with urinary retention.    Plan: Advance diet Encourage ambulation Advance to PO medication Will remove foley with a formal voiding trial later this morning or early this afternoon.   She may need to go home with an indwelling catheter if unable to void spontaneously today.  Will discontinue the IV Tylenol in favor of attempting PO pain meds.   Will Bonnet, MD 12/03/2015, 7:57 AM

## 2015-12-05 LAB — POCT PREGNANCY, URINE: Preg Test, Ur: NEGATIVE

## 2018-08-03 ENCOUNTER — Encounter: Payer: Self-pay | Admitting: Obstetrics and Gynecology

## 2018-08-03 ENCOUNTER — Ambulatory Visit (INDEPENDENT_AMBULATORY_CARE_PROVIDER_SITE_OTHER): Payer: Managed Care, Other (non HMO) | Admitting: Obstetrics and Gynecology

## 2018-08-03 VITALS — BP 102/60 | HR 68 | Ht 64.0 in | Wt 117.0 lb

## 2018-08-03 DIAGNOSIS — R102 Pelvic and perineal pain: Secondary | ICD-10-CM | POA: Diagnosis not present

## 2018-08-03 DIAGNOSIS — F419 Anxiety disorder, unspecified: Secondary | ICD-10-CM | POA: Diagnosis not present

## 2018-08-03 DIAGNOSIS — F329 Major depressive disorder, single episode, unspecified: Secondary | ICD-10-CM | POA: Insufficient documentation

## 2018-08-03 DIAGNOSIS — Z01419 Encounter for gynecological examination (general) (routine) without abnormal findings: Secondary | ICD-10-CM

## 2018-08-03 DIAGNOSIS — Z1339 Encounter for screening examination for other mental health and behavioral disorders: Secondary | ICD-10-CM | POA: Diagnosis not present

## 2018-08-03 DIAGNOSIS — Z1331 Encounter for screening for depression: Secondary | ICD-10-CM | POA: Diagnosis not present

## 2018-08-03 DIAGNOSIS — G8929 Other chronic pain: Secondary | ICD-10-CM

## 2018-08-03 NOTE — Assessment & Plan Note (Signed)
discussed her pain. She is status post TLH/BS in 2016 and had return of her pain 1 year later. This is a chronic and ongoing issue for her since the age of 70.  Her symptoms sound more consistent at this time with MSK pain. Discussed treatment options, including; surgery, medication, physical therapy, and doing nothing. After discussing the nature of her symptoms, mutual decision made to refer to pelvic floor physical therapy.

## 2018-08-03 NOTE — Progress Notes (Signed)
Gynecology Annual Exam  PCP: Patient, No Pcp Per  Chief Complaint  Patient presents with  . Gynecologic Exam    History of Present Illness:  Katie Blanchard is a 37 y.o. [phone] who LMP was Patient's last menstrual period was 11/19/2015., presents today for her annual examination.  She has not menses due to hysterectomy in 2016.   She is single partner, contraception - status post hysterectomy.  Last Pap: 09/2015  Results were: no abnormalities /neg HPV DNA negative Hx of STDs: none  Last mammogram: n/a There is no FH of breast cancer. There is no FH of ovarian cancer. The patient does not do self-breast exams.  Tobacco use: The patient denies current or previous tobacco use. Alcohol use: social drinker Exercise: not active  The patient wears seatbelts: yes.      She states that her pain has returned to her left side.  She made it about a year before her pain started coming back.  She moved away shortly after her surgery. She has not talked about her pain to another provider. The pain is in the left lower quadrant. The pain does not radiate.  She rates the pain as a 5/10 when present. The pain comes randomly.  No identified aggravating factors.  Alleviating factors: she stops doing what she is doing when the pain starts.  Some activity can trigger the pain. When she stops or takes it easy, the pain will ease off after about five minutes. She states she gets this pain every other day. She believes the pain is more frequent more recently.  She has no associated symptoms. Denies hematochezia, hematuria. Denies diarrhea and constipation.  She does not note the pain to be present when she is sitting still.    She notes that she has had some emotional changes since her move.  The big effect was when she moved away from her family.  At that time she was not able to work.  She has had trouble with her daughter with deaths in the family. Moving back has not helped.  She has has some problems in  her marriage.   She has not talked to a therapist about her issues.   Past Medical History:  Diagnosis Date  . Status post laparoscopic hysterectomy 12/02/2015    Past Surgical History:  Procedure Laterality Date  . ABDOMINAL HYSTERECTOMY  2016  . BREAST MASS EXCISION Right 08-26-15   fibroadenoma  . breast mass removal Left 2013   Fibroadenoma  . CESAREAN SECTION     x2  . CYSTOSCOPY N/A 12/02/2015   Procedure: CYSTOSCOPY;  Surgeon: Will Bonnet, MD;  Location: [facility];  Service: Gynecology;  Laterality: N/A;  . LAPAROSCOPIC HYSTERECTOMY N/A 12/02/2015   Procedure: HYSTERECTOMY TOTAL LAPAROSCOPIC/BILATERAL SALPINGECTOMY;  Surgeon: Will Bonnet, MD;  Location: [facility] OR[facility];  Service: Gynecology;  Laterality: N/A;  . LAPAROSCOPY  [id number]  . TOE SURGERY Left 2002  . TUBAL LIGATION      Prior to Admission medications: denies     Allergies  Allergen Reactions  . Prednazoline Anaphylaxis    prednisone  . Prednisone Anaphylaxis  . Azithromycin Nausea And Vomiting    Gynecologic History: Patient's last menstrual period was 11/19/2015.  Obstetric History: [id number], s/p c-section x 2  Social History   Socioeconomic History  . Marital status: Married    Spouse name: Not on file  . Number of children: Not on file  . Years of education: Not on file  .  Highest education level: Not on file  Occupational History  . Not on file  Social Needs  . Financial resource strain: Not on file  . Food insecurity:    Worry: Not on file    Inability: Not on file  . Transportation needs:    Medical: Not on file    Non-medical: Not on file  Tobacco Use  . Smoking status: Never Smoker  . Smokeless tobacco: Never Used  Substance and Sexual Activity  . Alcohol use: No    Alcohol/week: 0.0 standard drinks  . Drug use: No  . Sexual activity: Yes    Birth control/protection: Surgical    Comment: Hysterectomy   Lifestyle  . Physical activity:    Days per week: Not on file     Minutes per session: Not on file  . Stress: Not on file  Relationships  . Social connections:    Talks on phone: Not on file    Gets together: Not on file    Attends religious service: Not on file    Active member of club or organization: Not on file    Attends meetings of clubs or organizations: Not on file    Relationship status: Not on file  . Intimate partner violence:    Fear of current or ex partner: Not on file    Emotionally abused: Not on file    Physically abused: Not on file    Forced sexual activity: Not on file  Other Topics Concern  . Not on file  Social History Narrative  . Not on file    Family History  Problem Relation Age of Onset  . Breast cancer Maternal Aunt   . Uterine cancer Mother   . Throat cancer Maternal Uncle     Review of Systems  Constitutional: Negative.   HENT: Negative.   Eyes: Negative.   Respiratory: Negative.   Cardiovascular: Negative.   Gastrointestinal: Negative.   Genitourinary: Negative.   Musculoskeletal: Negative.   Skin: Negative.        +breast tenderness  Neurological: Negative.   Psychiatric/Behavioral: Positive for depression. Negative for hallucinations, memory loss, substance abuse and suicidal ideas. The patient is nervous/anxious. The patient does not have insomnia.      Physical Exam BP 102/60   Pulse 68   Ht 5\' 4"  (1.626 m)   Wt 117 lb (53.1 kg)   LMP 11/19/2015   BMI 20.08 kg/m    Physical Exam  Constitutional: She is oriented to person, place, and time. She appears well-developed and well-nourished. No distress.  Genitourinary: Pelvic exam was performed with patient supine. There is no rash, tenderness, lesion or injury on the right labia. There is no rash, tenderness, lesion or injury on the left labia. No erythema, tenderness or bleeding in the vagina. No signs of injury around the vagina. No vaginal discharge found. Right adnexum does not display mass, does not display tenderness and does not display  fullness. Left adnexum does not display mass, does not display tenderness and does not display fullness.  Genitourinary Comments: Uterus and cervix surgically absent. Vaginal cuff is well-healed without lesions.  HENT:  Head: Normocephalic and atraumatic.  Eyes: EOM are normal. No scleral icterus.  Neck: Normal range of motion. Neck supple. No thyromegaly present.  Cardiovascular: Normal rate and regular rhythm. Exam reveals no gallop and no friction rub.  No murmur heard. Pulmonary/Chest: Effort normal and breath sounds normal. No respiratory distress. She has no wheezes. She has no rales. Right breast  exhibits no inverted nipple, no mass, no nipple discharge, no skin change and no tenderness. Left breast exhibits no inverted nipple, no mass, no nipple discharge, no skin change and no tenderness.  Abdominal: Soft. Bowel sounds are normal. She exhibits no distension and no mass. There is no tenderness. There is no rebound and no guarding.  Musculoskeletal: Normal range of motion. She exhibits no edema or tenderness.  Lymphadenopathy:    She has no cervical adenopathy.       Right: No inguinal adenopathy present.       Left: No inguinal adenopathy present.  Neurological: She is alert and oriented to person, place, and time. No cranial nerve deficit.  Skin: Skin is warm and dry. No rash noted. No erythema.  Psychiatric: She has a normal mood and affect. Her behavior is normal. Judgment normal.    Female chaperone present for pelvic and breast  portions of the physical exam  Results:   Office Visit from 08/03/2018 in [facility]  AUDIT-C Score  2     Depression screen [facility] 2/9 08/03/2018  Decreased Interest 0  Down, Depressed, Hopeless 1  PHQ - 2 Score 1  Altered sleeping 2  Tired, decreased energy 1  Change in appetite 0  Feeling bad or failure about yourself  1  Trouble concentrating 0  Moving slowly or fidgety/restless 0  Suicidal thoughts 0  PHQ-9 Score 5  Difficult  doing work/chores Somewhat difficult   Assessment: 37 y.o. [id number] female here for routine annual gynecologic examination.  Plan: Problem List Items Addressed This Visit      Other   Chronic pelvic pain in female   Relevant Orders   Ambulatory referral to Physical Therapy   Anxiety and depression    Other Visit Diagnoses    Women's annual routine gynecological examination    -  Primary   Screening for depression       Screening for alcoholism          Screening: -- Blood pressure screen normal -- Colonoscopy - not due -- Mammogram - not due -- Weight screening: normal -- Depression screening negative (PHQ-9) -- Nutrition: normal -- cholesterol screening: not due for screening -- osteoporosis screening: not due -- tobacco screening: not using -- alcohol screening: AUDIT questionnaire indicates low-risk usage. -- family history of breast cancer screening: done. not at high risk. -- no evidence of domestic violence or intimate partner violence. -- STD screening: gonorrhea/chlamydia NAAT not collected per patient request. -- pap smear not collected per ASCCP guidelines  Chronic Pelvic pain, female: discussed her pain. She is status post TLH/BS in 2016 and had return of her pain 1 year later. This is a chronic and ongoing issue for her since the age of 24.  Her symptoms sound more consistent at this time with MSK pain. Discussed treatment options, including; surgery, medication, physical therapy, and doing nothing. After discussing the nature of her symptoms, mutual decision made to refer to pelvic floor physical therapy.    Anxiety/Depression:  She is able to contract for safety. Much of her situation sounds very situational, but chronic in nature.  We discussed therapy/counseling and/or medication. She was provided with a list of counselors that are local.  She would like to try counseling first.  We discussed referral to psychiatry, which I do not think is warranted at this time  based on her symptoms and lack of any other treatment. Mutual decision made to defer medication therapy at this time. But, we  will continue to consider this.    25 minutes spent in face to face discussion with > 50% spent in counseling,management, and coordination of care of her anxiety/depression and chronic pelvic pain.  This is still an established patient with documented visits in the old EMR as recently as 12/2015.    Prentice Docker, MD 08/03/2018 12:26 PM

## 2018-08-03 NOTE — Patient Instructions (Addendum)
Therapists/Counselors/Psychologists   Karen Canada, LPC, Michelle Van Horton, Cindy and Susan Gary Bailey, CSW [phone]       [phone] [address]      [address] [place], [place] [address]      [place], [place] [address]   Julia Tabor, LPC       [place], [place] [phone]       [phone] [address]     [address] [place], [place] [address]      [place], [place] [address]   Joanna Warren, LMFT      Tina Thompson [phone]       [phone] [address]      [address] [place], [place] [address]      [place], [place] [address]   Amanda Miller       Bart McCormick [phone]       [phone] [address]      [address] [place], [place] [address]      [place], [place] [address]   Cristin Saffo, PsyD      Cheryl Lawson, LPC [phone]       [phone] [address]      [address]  [place], [place] [address]      [place], [place] [address]   Courtney Jones       Laura Ellington [phone]       [facility] [address]     [phone] [place], [place] [address]      [email or web address]     Sation Konchella      Carmen Bork [phone]       [facility] [address]      [phone] [place], [place] [address]      [email or web address]      

## 2018-08-03 NOTE — Assessment & Plan Note (Signed)
She is able to contract for safety. Much of her situation sounds very situational, but chronic in nature.  We discussed therapy/counseling and/or medication. She was provided with a list of counselors that are local.  She would like to try counseling first.  We discussed referral to psychiatry, which I do not think is warranted at this time based on her symptoms and lack of any other treatment. Mutual decision made to defer medication therapy at this time. But, we will continue to consider this.

## 2018-08-09 ENCOUNTER — Encounter: Payer: Self-pay | Admitting: Obstetrics and Gynecology

## 2019-08-01 ENCOUNTER — Ambulatory Visit
Admission: EM | Admit: 2019-08-01 | Discharge: 2019-08-01 | Disposition: A | Discharge status: Home / Self Care | Payer: Managed Care, Other (non HMO) | Attending: Emergency Medicine | Admitting: Emergency Medicine

## 2019-08-01 ENCOUNTER — Encounter: Payer: Self-pay | Admitting: Emergency Medicine

## 2019-08-01 DIAGNOSIS — R350 Frequency of micturition: Secondary | ICD-10-CM | POA: Diagnosis not present

## 2019-08-01 DIAGNOSIS — T148XXA Other injury of unspecified body region, initial encounter: Secondary | ICD-10-CM

## 2019-08-01 LAB — URINALYSIS, COMPLETE (UACMP) WITH MICROSCOPIC
Bilirubin Urine: NEGATIVE
Glucose, UA: NEGATIVE mg/dL
Leukocytes,Ua: NEGATIVE
Nitrite: NEGATIVE
Protein, ur: NEGATIVE mg/dL
Specific Gravity, Urine: 1.025 (ref 1.005–1.030)
WBC, UA: NONE SEEN WBC/hpf (ref 0–5)
pH: 6 (ref 5.0–8.0)

## 2019-08-01 MED ORDER — CYCLOBENZAPRINE HCL 10 MG PO TABS: 10.0000 mg | ORAL_TABLET | Freq: Two times a day (BID) | ORAL | 0 refills | Status: DC | PRN

## 2019-08-01 MED ORDER — SULFAMETHOXAZOLE-TRIMETHOPRIM 800-160 MG PO TABS: 1.0000 | ORAL_TABLET | Freq: Two times a day (BID) | ORAL | 0 refills | Status: AC

## 2019-08-01 NOTE — Discharge Instructions (Signed)
Take medication as prescribed. Rest. Drink plenty of fluids. Stretch. Ice. Heat.   Follow up with your primary care physician this week as needed. Return to Urgent care for new or worsening concerns.

## 2019-08-01 NOTE — ED Triage Notes (Signed)
Patient c/o mid to lower back pain that started yesterday. She also reports urinary frequency that started this morning.

## 2019-08-01 NOTE — ED Provider Notes (Signed)
[facility] ____________________________________________  Time seen: Approximately 4:01 PM  I have reviewed the triage vital signs and the nursing notes.   HISTORY  Chief Complaint Back Pain and Urinary Urgency   HPI Katie Blanchard is a 38 y.o. female presenting for evaluation of middle to low back pain present since yesterday.  States pain intermittently contracts or spasms particularly with movement and sometimes at rest.  Did also notice some urinary frequency associated with this.  Denies burning with urination, vaginal discharge, vaginal discomfort or abdominal pain.  No recent cough, congestion, chest pain, shortness of breath or fevers.  Continues eat and drink well.  Reports otherwise doing well.  Denies alleviating measures.  Patient's last menstrual period was 11/19/2015.   Past Medical History:  Diagnosis Date  . Family history of breast cancer   . Status post laparoscopic hysterectomy 12/02/2015    Patient Active Problem List   Diagnosis Date Noted  . Anxiety and depression 08/03/2018  . Chronic pelvic pain in female 12/02/2015  . Status post laparoscopic hysterectomy 12/02/2015  . 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 ABDOMINAL HYSTERECTOMY  2016  . BREAST MASS EXCISION Right 08-26-15   fibroadenoma  . breast mass removal Left 2013   Fibroadenoma  . CESAREAN SECTION     x2  . CYSTOSCOPY N/A 12/02/2015   Procedure: CYSTOSCOPY;  Surgeon: Will Bonnet, MD;  Location: [facility] OR[facility];  Service: Gynecology;  Laterality: N/A;  . LAPAROSCOPIC HYSTERECTOMY N/A 12/02/2015   Procedure: HYSTERECTOMY TOTAL LAPAROSCOPIC/BILATERAL SALPINGECTOMY;  Surgeon: Will Bonnet, MD;  Location: [facility] OR[facility];  Service: Gynecology;  Laterality: N/A;  . LAPAROSCOPY  [id number]  . TOE SURGERY Left 2002  . TUBAL LIGATION       No current facility-administered medications for this encounter.   Current Outpatient  Medications:  .  cyclobenzaprine (FLEXERIL) 10 MG tablet, Take 1 tablet (10 mg total) by mouth 2 (two) times daily as needed for muscle spasms. Do not drive while taking as can cause drowsiness, Disp: 15 tablet, Rfl: 0 .  sulfamethoxazole-trimethoprim (BACTRIM DS) 800-160 MG tablet, Take 1 tablet by mouth 2 (two) times daily for 3 days., Disp: 6 tablet, Rfl: 0  Allergies Prednazoline, Prednisone, and Azithromycin  Family History  Problem Relation Age of Onset  . Breast cancer Maternal Aunt 41  . Uterine cancer Mother 3  . Throat cancer Maternal Uncle     Social History Social History   Tobacco Use  . Smoking status: Never Smoker  . Smokeless tobacco: Never Used  Substance Use Topics  . Alcohol use: No    Alcohol/week: 0.0 standard drinks  . Drug use: No    Review of Systems Constitutional: No fever ENT: No sore throat. Cardiovascular: Denies chest pain. Respiratory: Denies shortness of breath. Gastrointestinal: No abdominal pain.  No nausea, no vomiting.  No diarrhea. Genitourinary: Positive urinary frequency Musculoskeletal: Positive for back pain. Skin: Negative for rash.   ____________________________________________   PHYSICAL EXAM:  VITAL SIGNS: ED Triage Vitals [08/01/19 1520]  Enc Vitals Group     BP 115/86     Pulse Rate 73     Resp 18     Temp 98.3 F (36.8 C)     Temp Source Oral     SpO2 100 %     Weight 117 lb (53.1 kg)     Height 5\' 4"  (1.626 m)     Head Circumference  Peak Flow      Pain Score 7     Pain Loc      Pain Edu?      Excl. in [place]?     Constitutional: Alert and oriented. Well appearing and in no acute distress. Eyes: Conjunctivae are normal.  ENT      Head: Normocephalic and atraumatic. Cardiovascular: Normal rate, regular rhythm. Grossly normal heart sounds.  Good peripheral circulation. Respiratory: Normal respiratory effort without tachypnea nor retractions. Breath sounds are clear and equal bilaterally. No wheezes,  rales, rhonchi. Gastrointestinal: Soft and nontender. No CVA tenderness. Musculoskeletal: No midline cervical, thoracic or lumbar tenderness to palpation.  Except: Bilateral mid latissimus dorsi tenderness palpation, no midline tenderness, pain with right and left rotation as well as right and left arm overhead extension, no pain with lumbar flexion and extension, steady gait, no saddle anesthesia, no pain with standing bilateral knee lifts. Neurologic:  Normal speech and language. No gross focal neurologic deficits are appreciated. Speech is normal. No gait instability.  Skin:  Skin is warm, dry and intact. No rash noted. Psychiatric: Mood and affect are normal. Speech and behavior are normal. Patient exhibits appropriate insight and judgment   ___________________________________________   LABS (all labs ordered are listed, but only abnormal results are displayed)  Labs Reviewed  URINALYSIS, COMPLETE (UACMP) WITH MICROSCOPIC - Abnormal; Notable for the following components:      Result Value   Hgb urine dipstick TRACE (*)    Ketones, ur TRACE (*)    Bacteria, UA RARE (*)    All other components within normal limits  URINE CULTURE     PROCEDURES Procedures    INITIAL IMPRESSION / ASSESSMENT AND PLAN / ED COURSE  Pertinent labs & imaging results that were available during my care of the patient were reviewed by me and considered in my medical decision making (see chart for details).  Well-appearing patient.  No acute distress.  Low back pain suspect musculoskeletal muscle strain.  Patient reports she was doing a lot of bending and twisting at the time of onset of pain while at work.  Urinalysis also potential for UTI, will treat with 3-day course of Bactrim.  Over-the-counter ibuprofen and Flexeril.  Discussed stretch, ice and avoidance of aggravating activity.  Work note given.Discussed indication, risks and benefits of medications with patient.  Discussed follow up with Primary  care physician this week. Discussed follow up and return parameters including no resolution or any worsening concerns. Patient verbalized understanding and agreed to plan.   ____________________________________________   FINAL CLINICAL IMPRESSION(S) / ED DIAGNOSES  Final diagnoses:  Urinary frequency  Muscle strain     ED Discharge Orders         Ordered    cyclobenzaprine (FLEXERIL) 10 MG tablet  2 times daily PRN     08/01/19 1600    sulfamethoxazole-trimethoprim (BACTRIM DS) 800-160 MG tablet  2 times daily     08/01/19 1600           Note: This dictation was prepared with Dragon dictation along with smaller phrase technology. Any transcriptional errors that result from this process are unintentional.         Marylene Land, NP 08/01/19 1628

## 2019-08-02 LAB — URINE CULTURE: Culture: <10000 — AB

## 2019-11-02 ENCOUNTER — Other Ambulatory Visit: Payer: Self-pay

## 2019-11-02 ENCOUNTER — Ambulatory Visit
Admission: EM | Admit: 2019-11-02 | Discharge: 2019-11-02 | Disposition: A | Discharge status: Home / Self Care | Payer: Managed Care, Other (non HMO) | Attending: Emergency Medicine | Admitting: Emergency Medicine

## 2019-11-02 ENCOUNTER — Encounter: Payer: Self-pay | Admitting: Emergency Medicine

## 2019-11-02 DIAGNOSIS — N390 Urinary tract infection, site not specified: Secondary | ICD-10-CM | POA: Diagnosis not present

## 2019-11-02 LAB — URINALYSIS, COMPLETE (UACMP) WITH MICROSCOPIC
Bilirubin Urine: NEGATIVE
Glucose, UA: NEGATIVE mg/dL
Hgb urine dipstick: NEGATIVE
Ketones, ur: NEGATIVE mg/dL
Leukocytes,Ua: NEGATIVE
Nitrite: NEGATIVE
Protein, ur: NEGATIVE mg/dL
RBC / HPF: NONE SEEN RBC/hpf (ref 0–5)
Specific Gravity, Urine: 1.025 (ref 1.005–1.030)
pH: 7 (ref 5.0–8.0)

## 2019-11-02 MED ORDER — NITROFURANTOIN MONOHYD MACRO 100 MG PO CAPS: 100.0000 mg | ORAL_CAPSULE | Freq: Two times a day (BID) | ORAL | 0 refills | Status: DC

## 2019-11-02 MED ORDER — PHENAZOPYRIDINE HCL 200 MG PO TABS: 200.0000 mg | ORAL_TABLET | Freq: Three times a day (TID) | ORAL | 0 refills | Status: DC | PRN

## 2019-11-02 NOTE — ED Provider Notes (Signed)
HPI  SUBJECTIVE:  Katie Blanchard is a 38 y.o. female who presents with 4 weeks of odorous urine, urinary urgency, frequency, low abdominal pressure described as pinching with urination.  She reports very mild vaginal itching once or twice.  She denies dysuria, other abdominal pain, back pain, pelvic pain.  No vaginal odor, bleeding, discharge, rash.  She is in a long-term monogamous relationship with a female, who is asymptomatic, STDs are not a concern today.  She drinks less than 2 L of water a day. no perfumed soaps or body washes, recent antibiotics.  She tried Azo, increasing her fluid intake.  No alleviating factors.  Symptoms are worse with urination.  She has a past medical history of UTI.  No history of pyelonephritis, nephrolithiasis, diabetes, gonorrhea, chlamydia, HIV, HSV, syphilis, trichomonas, BV, yeast.  LMP: Status post hysterectomy.  PMD: Dr. Glennon Mac.    Past Medical History:  Diagnosis Date  . Family history of breast cancer   . Status post laparoscopic hysterectomy 12/02/2015    Past Surgical History:  Procedure Laterality Date  . ABDOMINAL HYSTERECTOMY  2016  . BREAST MASS EXCISION Right 08-26-15   fibroadenoma  . breast mass removal Left 2013   Fibroadenoma  . CESAREAN SECTION     x2  . CYSTOSCOPY N/A 12/02/2015   Procedure: CYSTOSCOPY;  Surgeon: Will Bonnet, MD;  Location: [facility] ORS;  Service: Gynecology;  Laterality: N/A;  . LAPAROSCOPIC HYSTERECTOMY N/A 12/02/2015   Procedure: HYSTERECTOMY TOTAL LAPAROSCOPIC/BILATERAL SALPINGECTOMY;  Surgeon: Will Bonnet, MD;  Location: [facility] ORS;  Service: Gynecology;  Laterality: N/A;  . LAPAROSCOPY  [id number]  . TOE SURGERY Left 2002  . TUBAL LIGATION      Family History  Problem Relation Age of Onset  . Breast cancer Maternal Aunt 41  . Uterine cancer Mother 79  . Throat cancer Maternal Uncle     Social History   Tobacco Use  . Smoking status: Never Smoker  . Smokeless tobacco: Never Used  Substance Use  Topics  . Alcohol use: No    Alcohol/week: 0.0 standard drinks  . Drug use: No    No current facility-administered medications for this encounter.   Current Outpatient Medications:  .  cyclobenzaprine (FLEXERIL) 10 MG tablet, Take 1 tablet (10 mg total) by mouth 2 (two) times daily as needed for muscle spasms. Do not drive while taking as can cause drowsiness, Disp: 15 tablet, Rfl: 0 .  nitrofurantoin, macrocrystal-monohydrate, (MACROBID) 100 MG capsule, Take 1 capsule (100 mg total) by mouth 2 (two) times daily. X 5 days, Disp: 10 capsule, Rfl: 0 .  phenazopyridine (PYRIDIUM) 200 MG tablet, Take 1 tablet (200 mg total) by mouth 3 (three) times daily as needed for pain., Disp: 6 tablet, Rfl: 0  Allergies  Allergen Reactions  . Prednazoline Anaphylaxis    prednisone  . Prednisone Anaphylaxis  . Azithromycin Nausea And Vomiting     ROS  As noted in HPI.   Physical Exam  BP 116/71 (BP Location: Right Arm)   Pulse 69   Temp 98.2 F (36.8 C) (Oral)   Resp 14   Ht 5\' 4"  (1.626 m)   Wt 53.5 kg   LMP 11/19/2015   SpO2 100%   BMI 20.25 kg/m   Constitutional: Well developed, well nourished, no acute distress Eyes:  EOMI, conjunctiva normal bilaterally HENT: Normocephalic, atraumatic,mucus membranes moist Respiratory: Normal inspiratory effort Cardiovascular: Normal rate GI: nondistended.  No suprapubic, flank tenderness Back: No CVAT skin: No rash, skin  intact Musculoskeletal: no deformities Neurologic: Alert & oriented x 3, no focal neuro deficits Psychiatric: Speech and behavior appropriate   ED Course   Medications - No data to display  Orders Placed This Encounter  Procedures  . Urine culture    Standing Status:   Standing    Number of Occurrences:   1  . Urinalysis, Complete w Microscopic    Standing Status:   Standing    Number of Occurrences:   1    Results for orders placed or performed during the hospital encounter of 11/02/19 (from the past 24  hour(s))  Urinalysis, Complete w Microscopic     Status: Abnormal   Collection Time: 11/02/19 12:59 PM  Result Value Ref Range   Color, Urine YELLOW YELLOW   APPearance CLEAR CLEAR   Specific Gravity, Urine 1.025 1.005 - 1.030   pH 7.0 5.0 - 8.0   Glucose, UA NEGATIVE NEGATIVE mg/dL   Hgb urine dipstick NEGATIVE NEGATIVE   Bilirubin Urine NEGATIVE NEGATIVE   Ketones, ur NEGATIVE NEGATIVE mg/dL   Protein, ur NEGATIVE NEGATIVE mg/dL   Nitrite NEGATIVE NEGATIVE   Leukocytes,Ua NEGATIVE NEGATIVE   Squamous Epithelial / LPF 0-5 0 - 5   WBC, UA 6-10 0 - 5 WBC/hpf   RBC / HPF NONE SEEN 0 - 5 RBC/hpf   Bacteria, UA FEW (A) NONE SEEN   No results found.  ED Clinical Impression  1. Lower urinary tract infectious disease      ED Assessment/Plan  No glucose in the urine, doubt diabetes.  She has a few bacteria and some WBCs which is suggestive of a urinary tract infection given her symptoms.  We will send her home with Macrobid, Pyridium.  Urine culture to confirm antibiotic choice.  Talk to patient about doing a wet prep to rule out other causes of odor, but she declined at this point time.  She will return here or follow-up with Dr. Glennon Mac if the [facility] and Pyridium do not help.  Discussed labs, MDM, treatment plan, and plan for follow-up with patient.  patient agrees with plan.   Meds ordered this encounter  Medications  . nitrofurantoin, macrocrystal-monohydrate, (MACROBID) 100 MG capsule    Sig: Take 1 capsule (100 mg total) by mouth 2 (two) times daily. X 5 days    Dispense:  10 capsule    Refill:  0  . phenazopyridine (PYRIDIUM) 200 MG tablet    Sig: Take 1 tablet (200 mg total) by mouth 3 (three) times daily as needed for pain.    Dispense:  6 tablet    Refill:  0    *This clinic note was created using [employer]. Therefore, there may be occasional mistakes despite careful proofreading.   ?   Melynda Ripple, MD 11/02/19 2002

## 2019-11-02 NOTE — Discharge Instructions (Addendum)
We have sent your urine off for culture to make sure that you have a urinary tract infection and to make sure that you are on the right antibiotic.  You need to drink at least 2 L of water a day.  Pyridium will help with your symptoms and the Macrobid will help with an infection.  We will call you if we need to change her therapy.  Follow-up with Dr. Glennon Mac or here if you do not improve after finishing the Macrobid and we can consider other causes of your symptoms such as bacterial vaginosis.

## 2019-11-02 NOTE — ED Triage Notes (Signed)
Patient c/o bladder pressure, urinary frequency, and cloudy urine for the past the past 2 weeks.  Patient denies fevers.  Patient denies any vaginal discharge or itching.

## 2019-11-05 ENCOUNTER — Telehealth (HOSPITAL_COMMUNITY): Payer: Self-pay | Admitting: Emergency Medicine

## 2019-11-05 LAB — URINE CULTURE: Culture: >=100000 — AB

## 2019-11-05 NOTE — Telephone Encounter (Signed)
Urine culture was positive for e coli and was given  macrobid at urgent care visit. Attempted to reach patient. No answer at this time.   

## 2020-02-11 ENCOUNTER — Other Ambulatory Visit: Payer: Self-pay

## 2020-02-11 ENCOUNTER — Ambulatory Visit
Admission: EM | Admit: 2020-02-11 | Discharge: 2020-02-11 | Disposition: A | Discharge status: Home / Self Care | Payer: Managed Care, Other (non HMO) | Attending: Family Medicine | Admitting: Family Medicine

## 2020-02-11 DIAGNOSIS — J069 Acute upper respiratory infection, unspecified: Secondary | ICD-10-CM | POA: Diagnosis not present

## 2020-02-11 DIAGNOSIS — Z888 Allergy status to other drugs, medicaments and biological substances status: Secondary | ICD-10-CM | POA: Insufficient documentation

## 2020-02-11 DIAGNOSIS — M545 Low back pain: Secondary | ICD-10-CM | POA: Diagnosis not present

## 2020-02-11 DIAGNOSIS — Z881 Allergy status to other antibiotic agents status: Secondary | ICD-10-CM | POA: Diagnosis not present

## 2020-02-11 DIAGNOSIS — R5383 Other fatigue: Secondary | ICD-10-CM

## 2020-02-11 DIAGNOSIS — M546 Pain in thoracic spine: Secondary | ICD-10-CM | POA: Insufficient documentation

## 2020-02-11 DIAGNOSIS — R519 Headache, unspecified: Secondary | ICD-10-CM

## 2020-02-11 DIAGNOSIS — Z20822 Contact with and (suspected) exposure to covid-19: Secondary | ICD-10-CM | POA: Diagnosis not present

## 2020-02-11 DIAGNOSIS — R6883 Chills (without fever): Secondary | ICD-10-CM | POA: Diagnosis present

## 2020-02-11 NOTE — ED Provider Notes (Signed)
[facility]    CSN: [id number] Arrival date & time: 02/11/20  1002      History   Chief Complaint Chief Complaint  Patient presents with  . Fever    HPI Katie Blanchard is a 39 y.o. female.   HPI. A 39 year old female presents with 1 day history of body aches chills and headache.  She states that she awoke yesterday with lower back pain and eventually involved her entire upper back.  She then had chills and sweats earlier this morning.  She denies any coughing.  She has had no sinus problems other than her usual allergies.  She denies fever.  Today she is afebrile pulse of 87 respirations 18 blood pressure 99/82 O2 sats on room air 100%.  She works at [employer] and is in contact with numerous people throughout the day.  She has no known exposures to Covid that are confirmed.         Past Medical History:  Diagnosis Date  . Family history of breast cancer   . Status post laparoscopic hysterectomy 12/02/2015    Patient Active Problem List   Diagnosis Date Noted  . Anxiety and depression 08/03/2018  . Chronic pelvic pain in female 12/02/2015  . Status post laparoscopic hysterectomy 12/02/2015  . 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 ABDOMINAL HYSTERECTOMY  2016  . BREAST MASS EXCISION Right 08-26-15   fibroadenoma  . breast mass removal Left 2013   Fibroadenoma  . CESAREAN SECTION     x2  . CYSTOSCOPY N/A 12/02/2015   Procedure: CYSTOSCOPY;  Surgeon: Will Bonnet, MD;  Location: [facility] ORS;  Service: Gynecology;  Laterality: N/A;  . LAPAROSCOPIC HYSTERECTOMY N/A 12/02/2015   Procedure: HYSTERECTOMY TOTAL LAPAROSCOPIC/BILATERAL SALPINGECTOMY;  Surgeon: Will Bonnet, MD;  Location: [facility] ORS;  Service: Gynecology;  Laterality: N/A;  . LAPAROSCOPY  [id number]  . TOE SURGERY Left 2002  . TUBAL LIGATION      OB History    Gravida  3   Para  3   Term  3   Preterm      AB      Living  3       SAB      TAB      Ectopic      Multiple      Live Births  3        Obstetric Comments  1st Menstrual Cycle:  14 1st Pregnancy:  26          Home Medications    Prior to Admission medications   Not on File    Family History Family History  Problem Relation Age of Onset  . Breast cancer Maternal Aunt 41  . Uterine cancer Mother 36  . Throat cancer Maternal Uncle     Social History Social History   Tobacco Use  . Smoking status: Never Smoker  . Smokeless tobacco: Never Used  Substance Use Topics  . Alcohol use: No    Alcohol/week: 0.0 standard drinks  . Drug use: No     Allergies   Prednazoline, Prednisone, and Azithromycin   Review of Systems Review of Systems  Constitutional: Positive for activity change, chills and fatigue. Negative for appetite change, diaphoresis and fever.  HENT: Negative for sinus pressure, sinus pain and sore throat.   Respiratory: Negative for cough and shortness of breath.   Musculoskeletal: Positive for back pain.  Neurological: Positive for  headaches.  All other systems reviewed and are negative.    Physical Exam Triage Vital Signs ED Triage Vitals  Enc Vitals Group     BP 02/11/20 1037 99/82     Pulse Rate 02/11/20 1037 87     Resp 02/11/20 1037 18     Temp 02/11/20 1037 98.2 F (36.8 C)     Temp Source 02/11/20 1037 Oral     SpO2 02/11/20 1037 100 %     Weight --      Height --      Head Circumference --      Peak Flow --      Pain Score 02/11/20 1033 6     Pain Loc --      Pain Edu? --      Excl. in [place]? --    No data found.  Updated Vital Signs BP 99/82 (BP Location: Left Arm)   Pulse 87   Temp 98.2 F (36.8 C) (Oral)   Resp 18   LMP 11/19/2015   SpO2 100%   Visual Acuity Right Eye Distance:   Left Eye Distance:   Bilateral Distance:    Right Eye Near:   Left Eye Near:    Bilateral Near:     Physical Exam Vitals and nursing note reviewed.  Constitutional:      General: She is  not in acute distress.    Appearance: Normal appearance. She is normal weight. She is not ill-appearing or toxic-appearing.  HENT:     Head: Normocephalic and atraumatic.     Right Ear: Tympanic membrane, ear canal and external ear normal.     Left Ear: Tympanic membrane, ear canal and external ear normal.     Nose: Nose normal.     Mouth/Throat:     Mouth: Mucous membranes are moist.     Pharynx: No oropharyngeal exudate or posterior oropharyngeal erythema.  Eyes:     Conjunctiva/sclera: Conjunctivae normal.  Cardiovascular:     Rate and Rhythm: Normal rate and regular rhythm.     Pulses: Normal pulses.     Heart sounds: Normal heart sounds.  Pulmonary:     Effort: Pulmonary effort is normal.     Breath sounds: Normal breath sounds.  Musculoskeletal:        General: Normal range of motion.     Cervical back: Normal range of motion and neck supple.  Skin:    General: Skin is warm and dry.  Neurological:     General: No focal deficit present.     Mental Status: She is alert and oriented to person, place, and time.  Psychiatric:        Mood and Affect: Mood normal.        Behavior: Behavior normal.        Thought Content: Thought content normal.        Judgment: Judgment normal.      UC Treatments / Results  Labs (all labs ordered are listed, but only abnormal results are displayed) Labs Reviewed  NOVEL CORONAVIRUS, NAA (HOSP ORDER, SEND-OUT TO REF LAB; TAT 18-24 HRS)    EKG   Radiology No results found.  Procedures Procedures (including critical care time)  Medications Ordered in UC Medications - No data to display  Initial Impression / Assessment and Plan / UC Course  I have reviewed the triage vital signs and the nursing notes.  Pertinent labs & imaging results that were available during my care of the patient were reviewed  by me and considered in my medical decision making (see chart for details).   39 year old female presents with a sudden onset of body  aches chills sweats and headache that started yesterday.  States that it is worse today.  Vital signs are normal today.  Physical exam also is normal.  He was tested for Covid.  She likely has an upper respiratory infection.  Treat her symptomatically at this point time.  I have recommended that she add Flonase to her Zyrtec regimen for her seasonal allergies.  She will drink plenty of fluids, get adequate rest and use Tylenol/ Motrin for the body aches or headaches.  She was given instructions for the Covid test.  If she is negative she may return to work when she is feeling improved.  If it is positive she will contact her job for their protocols for return to work..  If she is not improving or worsening she may return to our clinic.   Final Clinical Impressions(s) / UC Diagnoses   Final diagnoses:  Upper respiratory tract infection, unspecified type     Discharge Instructions     You were tested for COVID-19 today.  Results typically take 18 to 24 hours to become available.  You should remain quarantined until these are resulted.  If they are positive you should notify your workplace to see what their current work-up policy protocol is.  They are negative you may return to work when you are feeling better.  Consider using Flonase nasal spray for the next 2 weeks on a daily basis.  Take Tylenol or Motrin for your body aches pain and headache.  May return to our clinic if you are not improving or worsening.    ED Prescriptions    None     PDMP not reviewed this encounter.   Lorin Picket, PA-C 02/11/20 1120

## 2020-02-11 NOTE — Discharge Instructions (Addendum)
You were tested for COVID-19 today.  Results typically take 18 to 24 hours to become available.  You should remain quarantined until these are resulted.  If they are positive you should notify your workplace to see what their current work-up policy protocol is.  They are negative you may return to work when you are feeling better.  Consider using Flonase nasal spray for the next 2 weeks on a daily basis.  Take Tylenol or Motrin for your body aches pain and headache.  May return to our clinic if you are not improving or worsening.

## 2020-02-11 NOTE — ED Triage Notes (Signed)
Body aches, chills x 1 day.  HA today.  No SOB.  No known exposure.

## 2020-02-12 LAB — NOVEL CORONAVIRUS, NAA (HOSP ORDER, SEND-OUT TO REF LAB; TAT 18-24 HRS): SARS-CoV-2, NAA: NOT DETECTED

## 2020-09-13 ENCOUNTER — Ambulatory Visit
Admission: EM | Admit: 2020-09-13 | Discharge: 2020-09-13 | Disposition: A | Discharge status: Home / Self Care | Payer: Managed Care, Other (non HMO) | Attending: Emergency Medicine | Admitting: Emergency Medicine

## 2020-09-13 ENCOUNTER — Other Ambulatory Visit: Payer: Self-pay

## 2020-09-13 ENCOUNTER — Ambulatory Visit (INDEPENDENT_AMBULATORY_CARE_PROVIDER_SITE_OTHER): Payer: Managed Care, Other (non HMO)

## 2020-09-13 DIAGNOSIS — Z20822 Contact with and (suspected) exposure to covid-19: Secondary | ICD-10-CM | POA: Insufficient documentation

## 2020-09-13 DIAGNOSIS — G8929 Other chronic pain: Secondary | ICD-10-CM | POA: Insufficient documentation

## 2020-09-13 DIAGNOSIS — R079 Chest pain, unspecified: Secondary | ICD-10-CM | POA: Diagnosis not present

## 2020-09-13 DIAGNOSIS — R102 Pelvic and perineal pain: Secondary | ICD-10-CM | POA: Insufficient documentation

## 2020-09-13 DIAGNOSIS — R519 Headache, unspecified: Secondary | ICD-10-CM | POA: Insufficient documentation

## 2020-09-13 DIAGNOSIS — F419 Anxiety disorder, unspecified: Secondary | ICD-10-CM | POA: Insufficient documentation

## 2020-09-13 DIAGNOSIS — F329 Major depressive disorder, single episode, unspecified: Secondary | ICD-10-CM | POA: Diagnosis not present

## 2020-09-13 DIAGNOSIS — M940 Chondrocostal junction syndrome [Tietze]: Secondary | ICD-10-CM | POA: Diagnosis not present

## 2020-09-13 NOTE — Discharge Instructions (Addendum)
Your chest xray and EKG are normal and I believe your chest pain seems to be more chest wall inflammation per my exam and provoking the pain you fel when I palpated your chest. For this take Ibuprofen 600 mg three times a day for 7 days Lets wait for the Covid results, since I've had a couple of patients with covid that only had a headache.  Push more fluids since the common causes of lightlessness with change in position is dehydration and can also cause headaches.

## 2020-09-13 NOTE — ED Provider Notes (Signed)
[facility]    CSN: [id number] Arrival date & time: 09/13/20  1224      History   Chief Complaint Chief Complaint  Patient presents with  . Chest Pain  . Headache  . Nasal Congestion    HPI Katie Blanchard is a 39 y.o. female who developed nose congestion and rhinitis 9/10 with mild HA, scratchy throat. She thought it was just allergies and this lasted only a couple of days. Has been feeling fine til 4 days ago felt shakier while at work. Got herself something to drink and snack. The next 2 days the chest pain continues and felt worse. Cp is described as heaviness/pressure which intensifies in wakes and is constant. Had SOB 2 days ago when the CP got bad and had been moving around a lot and had to take her mask off. It is different than when he gets anxious.  HA's are off and on then gets shooting pains on her R occipital region, or other times is on the L.  Has been having a few episodes of feeling light headed related to position while at work. Had hysterectomy age 21.  Denies Covid injections.   Past Medical History:  Diagnosis Date  . Family history of breast cancer   . Status post laparoscopic hysterectomy 12/02/2015    Patient Active Problem List   Diagnosis Date Noted  . Anxiety and depression 08/03/2018  . Chronic pelvic pain in female 12/02/2015  . Status post laparoscopic hysterectomy 12/02/2015  . 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 ABDOMINAL HYSTERECTOMY  2016  . BREAST MASS EXCISION Right 08-26-15   fibroadenoma  . breast mass removal Left 2013   Fibroadenoma  . CESAREAN SECTION     x2  . CYSTOSCOPY N/A 12/02/2015   Procedure: CYSTOSCOPY;  Surgeon: Will Bonnet, MD;  Location: [facility] ORS;  Service: Gynecology;  Laterality: N/A;  . LAPAROSCOPIC HYSTERECTOMY N/A 12/02/2015   Procedure: HYSTERECTOMY TOTAL LAPAROSCOPIC/BILATERAL SALPINGECTOMY;  Surgeon: Will Bonnet, MD;  Location: [facility]  ORS;  Service: Gynecology;  Laterality: N/A;  . LAPAROSCOPY  [id number]  . TOE SURGERY Left 2002  . TUBAL LIGATION      OB History    Gravida  3   Para  3   Term  3   Preterm      AB      Living  3     SAB      TAB      Ectopic      Multiple      Live Births  3        Obstetric Comments  1st Menstrual Cycle:  14 1st Pregnancy:  26          Home Medications    Prior to Admission medications   Not on File    Family History Family History  Problem Relation Age of Onset  . Breast cancer Maternal Aunt 41  . Uterine cancer Mother 69  . Throat cancer Maternal Uncle   . CAD Other     Social History Social History   Tobacco Use  . Smoking status: Never Smoker  . Smokeless tobacco: Never Used  Vaping Use  . Vaping Use: Never used  Substance Use Topics  . Alcohol use: No    Alcohol/week: 0.0 standard drinks  . Drug use: No     Allergies   Prednazoline, Prednisone, and Azithromycin   Review of Systems  Review of Systems  Constitutional: Positive for fatigue. Negative for appetite change, chills, diaphoresis and fever.  HENT: Positive for congestion and rhinorrhea.   Eyes: Negative for visual disturbance.  Respiratory: Positive for chest tightness and shortness of breath. Negative for cough.   Cardiovascular: Positive for chest pain. Negative for palpitations and leg swelling.  Gastrointestinal: Negative for abdominal pain.  Endocrine: Negative for polydipsia, polyphagia and polyuria.  Genitourinary: Negative for dysuria and frequency.  Neurological: Positive for light-headedness and headaches. Negative for dizziness.    Physical Exam Triage Vital Signs ED Triage Vitals  Enc Vitals Group     BP 09/13/20 1256 121/77     Pulse Rate 09/13/20 1256 73     Resp 09/13/20 1256 16     Temp 09/13/20 1256 97.8 F (36.6 C)     Temp Source 09/13/20 1256 Oral     SpO2 09/13/20 1256 100 %     Weight 09/13/20 1259 118 lb (53.5 kg)     Height  09/13/20 1259 5\' 4"  (1.626 m)     Head Circumference --      Peak Flow --      Pain Score 09/13/20 1259 4     Pain Loc --      Pain Edu? --      Excl. in [place]? --    No data found.  Updated Vital Signs BP 93/77 (BP Location: Left Arm)   Pulse 84   Temp 97.8 F (36.6 C) (Oral)   Resp 16   Ht 5\' 4"  (1.626 m)   Wt 118 lb (53.5 kg)   LMP 11/19/2015   SpO2 100%   BMI 20.25 kg/m   Visual Acuity Right Eye Distance:   Left Eye Distance:   Bilateral Distance:    Right Eye Near:   Left Eye Near:    Bilateral Near:     Physical Exam Physical Exam Vitals signs and nursing note reviewed.  Constitutional:      General: He is not in acute distress.    Appearance: He is well-developed and normal weight. He is not ill-appearing, toxic-appearing or diaphoretic.  HENT: ENT neg    Head: Normocephalic.  Eyes:     Extraocular Movements: Extraocular movements intact.     Pupils: Pupils are equal, round, and reactive to light.  Neck:     Musculoskeletal: Neck supple. No neck rigidity.    Cardiovascular:     Rate and Rhythm: Normal rate and regular rhythm.     Heart sounds: No murmur. No edema Pulmonary:     Effort: Pulmonary effort is normal.     Breath sounds: Normal breath sounds. No wheezing, rhonchi or rales. Has R chest wall tenderness similar to what she has been experiencing.  Abdominal:     General: Bowel sounds are normal.     Palpations: Abdomen is soft. There is no mass.     Tenderness: There is no abdominal tenderness. There is no guarding.  Musculoskeletal: Normal range of motion. No calf tenderness or cords. Lymphadenopathy:     Cervical: No cervical adenopathy.  Skin:    General: Skin is warm and dry.  Neurological:     Mental Status: He is alert.     Cranial Nerves: No cranial nerve deficit or facial asymmetry.     Sensory: No sensory deficit.     Motor: No weakness.     Coordination: Romberg sign negative. Coordination normal.     Gait: Gait normal.     Deep  Tendon Reflexes: Reflexes normal.     Comments: Normal Romberg, finger to nose, tandem gait.   Psychiatric:        Mood and Affect: Mood normal.        Speech: Speech normal.        Behavior: Behavior normal.     UC Treatments / Results  Labs (all labs ordered are listed, but only abnormal results are displayed) Labs Reviewed  SARS CORONAVIRUS 2 (TAT 6-24 HRS)    EKG   Radiology DG Chest 2 View  Result Date: 09/13/2020 CLINICAL DATA:  Chest pain for several days EXAM: CHEST - 2 VIEW COMPARISON:  None. FINDINGS: The heart size and mediastinal contours are within normal limits. Both lungs are clear. The visualized skeletal structures are unremarkable. IMPRESSION: No active cardiopulmonary disease. Electronically Signed   By: Inez Catalina M.D.   On: 09/13/2020 13:59    Procedures Procedures (including critical care time)  Medications Ordered in UC Medications - No data to display  Initial Impression / Assessment and Plan / UC Course  I have reviewed the triage vital signs and the nursing notes. She is a little orthostatic. Could also be she is getting covid which test is pending. The chest pain seems to be chest wall. I tried to do EKG, but our machine broke, but pt made aware of this and if taking the  Ibuprofen does not help or the chest pressure gets worse, needs to go to ER. See instructions.  Pertinent labs & imaging results that were available during my care of the patient were reviewed by me and considered in my medical decision making (see chart for details).  Final Clinical Impressions(s) / UC Diagnoses   Final diagnoses:  Nonintractable headache, unspecified chronicity pattern, unspecified headache type  Costochondritis     Discharge Instructions     Your chest xray and EKG are normal and I believe your chest pain seems to be more chest wall inflammation per my exam and provoking the pain you fel when I palpated your chest. For this take Ibuprofen 600 mg three  times a day for 7 days Lets wait for the Covid results, since I've had a couple of patients with covid that only had a headache.  Push more fluids since the common causes of lightlessness with change in position is dehydration and can also cause headaches.     ED Prescriptions    None     PDMP not reviewed this encounter.   Shelby Mattocks, PA-C 09/13/20 1709

## 2020-09-13 NOTE — ED Triage Notes (Addendum)
Patient in today w/ c/o chest pain x 4-5 days, H/A intermittent over a week w/ more consistency the past couple days, fatigue, some sinus congestion in the last 2 weeks. Patient denies cough, loss of taste/smell, numbness or tingling in arms.   Patient is not vaccinated against COVID-19.

## 2020-09-14 LAB — SARS CORONAVIRUS 2 (TAT 6-24 HRS): SARS Coronavirus 2: NEGATIVE

## 2021-01-06 LAB — SURGICAL PATHOLOGY

## 2021-03-12 ENCOUNTER — Ambulatory Visit
Admission: EM | Admit: 2021-03-12 | Discharge: 2021-03-12 | Disposition: A | Discharge status: Home / Self Care | Payer: Managed Care, Other (non HMO) | Attending: Physician Assistant | Admitting: Physician Assistant

## 2021-03-12 ENCOUNTER — Encounter: Payer: Self-pay | Admitting: Emergency Medicine

## 2021-03-12 ENCOUNTER — Other Ambulatory Visit: Payer: Self-pay

## 2021-03-12 DIAGNOSIS — J029 Acute pharyngitis, unspecified: Secondary | ICD-10-CM | POA: Insufficient documentation

## 2021-03-12 DIAGNOSIS — J019 Acute sinusitis, unspecified: Secondary | ICD-10-CM | POA: Insufficient documentation

## 2021-03-12 DIAGNOSIS — R059 Cough, unspecified: Secondary | ICD-10-CM | POA: Insufficient documentation

## 2021-03-12 HISTORY — DX: Other seasonal allergic rhinitis: J30.2

## 2021-03-12 LAB — GROUP A STREP BY PCR: Group A Strep by PCR: NOT DETECTED

## 2021-03-12 MED ORDER — AMOXICILLIN-POT CLAVULANATE 875-125 MG PO TABS: 1.0000 | ORAL_TABLET | Freq: Two times a day (BID) | ORAL | 0 refills | Status: AC

## 2021-03-12 MED ORDER — PROMETHAZINE-DM 6.25-15 MG/5ML PO SYRP: 5.0000 mL | ORAL_SOLUTION | Freq: Every evening | ORAL | 0 refills | Status: AC

## 2021-03-12 MED ORDER — AMOXICILLIN-POT CLAVULANATE 875-125 MG PO TABS: 1.0000 | ORAL_TABLET | Freq: Two times a day (BID) | ORAL | 0 refills | Status: DC

## 2021-03-12 NOTE — ED Provider Notes (Signed)
[facility]    CSN: [id number] Arrival date & time: 03/12/21  1325      History   Chief Complaint Chief Complaint  Patient presents with  . Sore Throat  . Cough  . sinus pressure  . Otalgia    HPI Katie Blanchard is a 40 y.o. female presenting for approximately 3-week history of nasal congestion and cough.  He says the cough is mostly dry but the nasal congestion/drainage is sometimes discolored.  Patient says that over the past 4 to 5 days she developed bilateral ear pain and increased throat pain.  Admits to some mild headaches.  Has had sinus pressure without significant pain.  Denies any chest tightness or breathing difficulty.  No fever, fatigue or body aches.  Patient's been taking multiple over-the-counter medications without relief.  She is tried over-the-counter Claritin and Zyrtec, Flonase, Mucinex, and Robitussin all without improvement in her symptoms.  Patient denies any Covid exposure.  Personal history of COVID-19 about 3 months ago.  No other concerns.  HPI  Past Medical History:  Diagnosis Date  . Family history of breast cancer   . Seasonal allergies   . Status post laparoscopic hysterectomy 12/02/2015    Patient Active Problem List   Diagnosis Date Noted  . Anxiety and depression 08/03/2018  . Chronic pelvic pain in female 12/02/2015  . Status post laparoscopic hysterectomy 12/02/2015  . 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 ABDOMINAL HYSTERECTOMY  2016  . BREAST MASS EXCISION Right 08-26-15   fibroadenoma  . breast mass removal Left 2013   Fibroadenoma  . CESAREAN SECTION     x2  . CYSTOSCOPY N/A 12/02/2015   Procedure: CYSTOSCOPY;  Surgeon: Will Bonnet, MD;  Location: [facility] ORS;  Service: Gynecology;  Laterality: N/A;  . LAPAROSCOPIC HYSTERECTOMY N/A 12/02/2015   Procedure: HYSTERECTOMY TOTAL LAPAROSCOPIC/BILATERAL SALPINGECTOMY;  Surgeon: Will Bonnet, MD;  Location: [facility]  ORS;  Service: Gynecology;  Laterality: N/A;  . LAPAROSCOPY  [id number]  . TOE SURGERY Left 2002  . TUBAL LIGATION      OB History    Gravida  3   Para  3   Term  3   Preterm      AB      Living  3     SAB      IAB      Ectopic      Multiple      Live Births  3        Obstetric Comments  1st Menstrual Cycle:  14 1st Pregnancy:  26          Home Medications    Prior to Admission medications   Medication Sig Start Date End Date Taking? Authorizing Provider  amoxicillin-clavulanate (AUGMENTIN) 875-125 MG tablet Take 1 tablet by mouth every 12 (twelve) hours for 7 days. 03/12/21 03/19/21 Yes Danton Clap, PA-C  cetirizine (ZYRTEC) 10 MG tablet Take 10 mg by mouth daily.   Yes [provider]  fluticasone (FLONASE) 50 MCG/ACT nasal spray Place 1 spray into both nostrils daily.   Yes [provider]    Family History Family History  Problem Relation Age of Onset  . Breast cancer Maternal Aunt 41  . Uterine cancer Mother 34  . Throat cancer Maternal Uncle   . Other Father        unknown medical history  . CAD Other     Social History  Social History   Tobacco Use  . Smoking status: Never Smoker  . Smokeless tobacco: Never Used  Vaping Use  . Vaping Use: Never used  Substance Use Topics  . Alcohol use: No    Alcohol/week: 0.0 standard drinks  . Drug use: No     Allergies   Prednazoline, Prednisone, and Azithromycin   Review of Systems Review of Systems  Constitutional: Negative for chills, diaphoresis, fatigue and fever.  HENT: Positive for congestion, ear pain, rhinorrhea, sinus pressure, sinus pain and sore throat.   Respiratory: Positive for cough. Negative for shortness of breath and wheezing.   Cardiovascular: Negative for chest pain.  Gastrointestinal: Negative for abdominal pain, nausea and vomiting.  Musculoskeletal: Negative for arthralgias and myalgias.  Skin: Negative for rash.  Neurological: Positive for  headaches. Negative for weakness.  Hematological: Negative for adenopathy.     Physical Exam Triage Vital Signs ED Triage Vitals  Enc Vitals Group     BP 03/12/21 1421 117/85     Pulse Rate 03/12/21 1421 83     Resp 03/12/21 1421 18     Temp 03/12/21 1421 98.3 F (36.8 C)     Temp Source 03/12/21 1421 Oral     SpO2 03/12/21 1421 100 %     Weight 03/12/21 1421 118 lb (53.5 kg)     Height 03/12/21 1421 5\' 4"  (1.626 m)     Head Circumference --      Peak Flow --      Pain Score 03/12/21 1420 7     Pain Loc --      Pain Edu? --      Excl. in [place]? --    No data found.  Updated Vital Signs BP 117/85 (BP Location: Left Arm)   Pulse 83   Temp 98.3 F (36.8 C) (Oral)   Resp 18   Ht 5\' 4"  (1.626 m)   Wt 118 lb (53.5 kg)   LMP 11/19/2015   SpO2 100%   BMI 20.25 kg/m    Physical Exam Vitals and nursing note reviewed.  Constitutional:      General: She is not in acute distress.    Appearance: Normal appearance. She is well-developed. She is not ill-appearing or toxic-appearing.  HENT:     Head: Normocephalic and atraumatic.     Right Ear: Ear canal and external ear normal. A middle ear effusion is present.     Left Ear: Ear canal and external ear normal. A middle ear effusion is present.     Nose: Congestion and rhinorrhea present.     Mouth/Throat:     Mouth: Mucous membranes are moist.     Pharynx: Oropharynx is clear. Posterior oropharyngeal erythema present.  Eyes:     General: No scleral icterus.       Right eye: No discharge.        Left eye: No discharge.     Conjunctiva/sclera: Conjunctivae normal.  Cardiovascular:     Rate and Rhythm: Normal rate and regular rhythm.     Heart sounds: Normal heart sounds.  Pulmonary:     Effort: Pulmonary effort is normal. No respiratory distress.     Breath sounds: Normal breath sounds.  Musculoskeletal:     Cervical back: Neck supple.  Skin:    General: Skin is dry.  Neurological:     General: No focal deficit  present.     Mental Status: She is alert. Mental status is at baseline.     Motor:  No weakness.     Gait: Gait normal.  Psychiatric:        Mood and Affect: Mood normal.        Behavior: Behavior normal.        Thought Content: Thought content normal.      UC Treatments / Results  Labs (all labs ordered are listed, but only abnormal results are displayed) Labs Reviewed  GROUP A STREP BY PCR    EKG   Radiology No results found.  Procedures Procedures (including critical care time)  Medications Ordered in UC Medications - No data to display  Initial Impression / Assessment and Plan / UC Course  I have reviewed the triage vital signs and the nursing notes.  Pertinent labs & imaging results that were available during my care of the patient were reviewed by me and considered in my medical decision making (see chart for details).   40 year old female presenting for 3-week history of cough and congestion. Strep negative. Since she is not improved with any of the over-the-counter medications, will try Augmentin at this time for possible secondary bacterial sinus infection.  Advised her to do Claritin-D during the day and continue with Flonase and start with nasal saline.  I did prescribe Promethazine DM for her to use at nighttime.  Advised to follow-up as needed for any new or worsening symptoms or if not better after completing antibiotics.  Final Clinical Impressions(s) / UC Diagnoses   Final diagnoses:  Acute sinusitis, recurrence not specified, unspecified location  Sore throat  Cough     Discharge Instructions     Continue Flonase and switch to Zyrtec-D during the day.  Add nasal saline rinses during the day. Take Promethazine DM at nighttime. Increase rest and fluids. Consider use of humidifier. Since your symptoms have been ongoing and not improving after 3 weeks, we will try an antibiotic.  Make sure you take the full course to be started. Follow-up with [place] or  PCP if you have a fever, worsening cough or breathing problem or if you are not better after you complete the antibiotics.    ED Prescriptions    Medication Sig Dispense Auth. Provider   amoxicillin-clavulanate (AUGMENTIN) 875-125 MG tablet Take 1 tablet by mouth every 12 (twelve) hours for 7 days. 14 tablet Gretta Cool     PDMP not reviewed this encounter.   Danton Clap, PA-C 03/12/21 1521

## 2021-03-12 NOTE — Discharge Instructions (Signed)
Continue Flonase and switch to Zyrtec-D during the day.  Add nasal saline rinses during the day. Take Promethazine DM at nighttime. Increase rest and fluids. Consider use of humidifier. Since your symptoms have been ongoing and not improving after 3 weeks, we will try an antibiotic.  Make sure you take the full course to be started. Follow-up with [place] or PCP if you have a fever, worsening cough or breathing problem or if you are not better after you complete the antibiotics.

## 2021-03-12 NOTE — ED Triage Notes (Signed)
Patient in today c/o sore throat x 2 days, otalgia (bilateral) 4-5 days and sinus pressure and cough x 3 weeks. Patient has taken OTC Claritin, Zyrtec, Flonase, Mucinex, Sudafed and Robitussin without relief. Patient denies fever. Patient has not had the covid vaccines. Patient had covid 12/03/20.

## 2022-04-04 IMAGING — CR DG CHEST 2V
2 series · 2 of 2 positions shown · non-contrast
Comparison: None.

CLINICAL DATA: Chest pain for several days

EXAM:
CHEST - 2 VIEW

[chest pa]
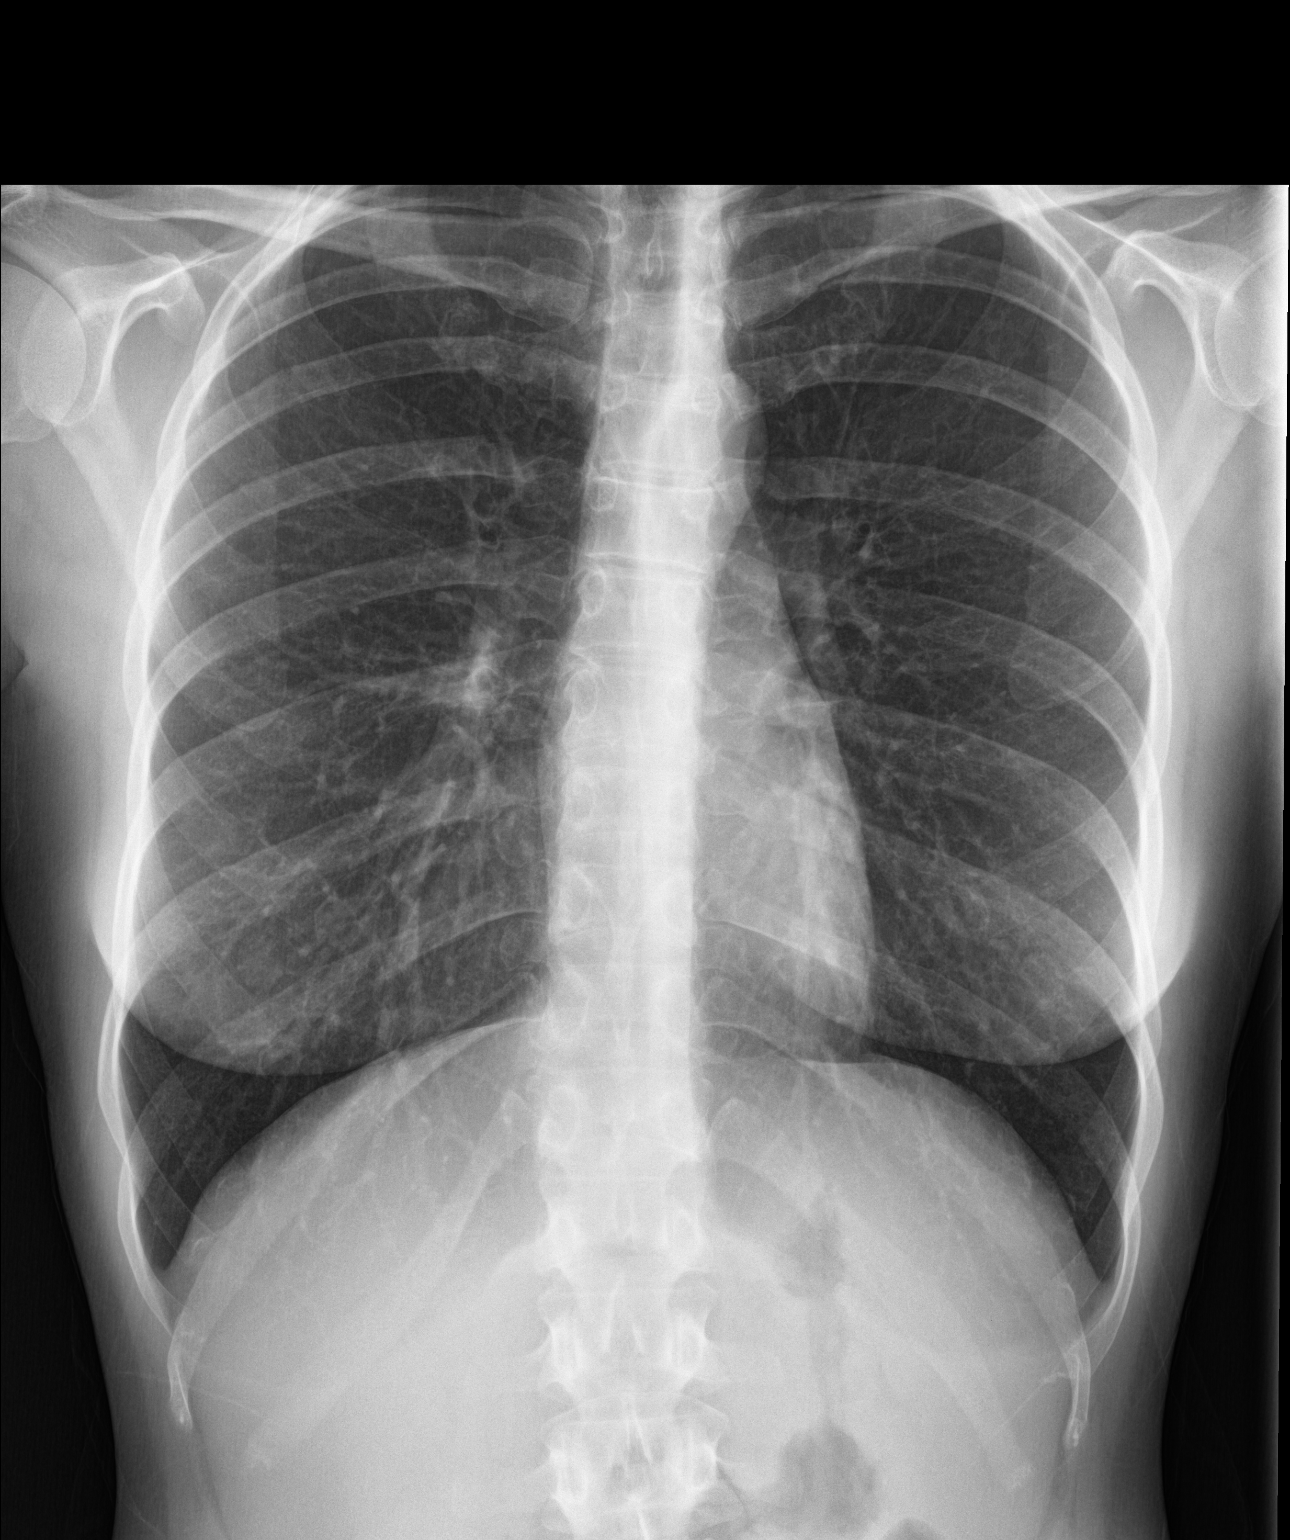

[chest lat]
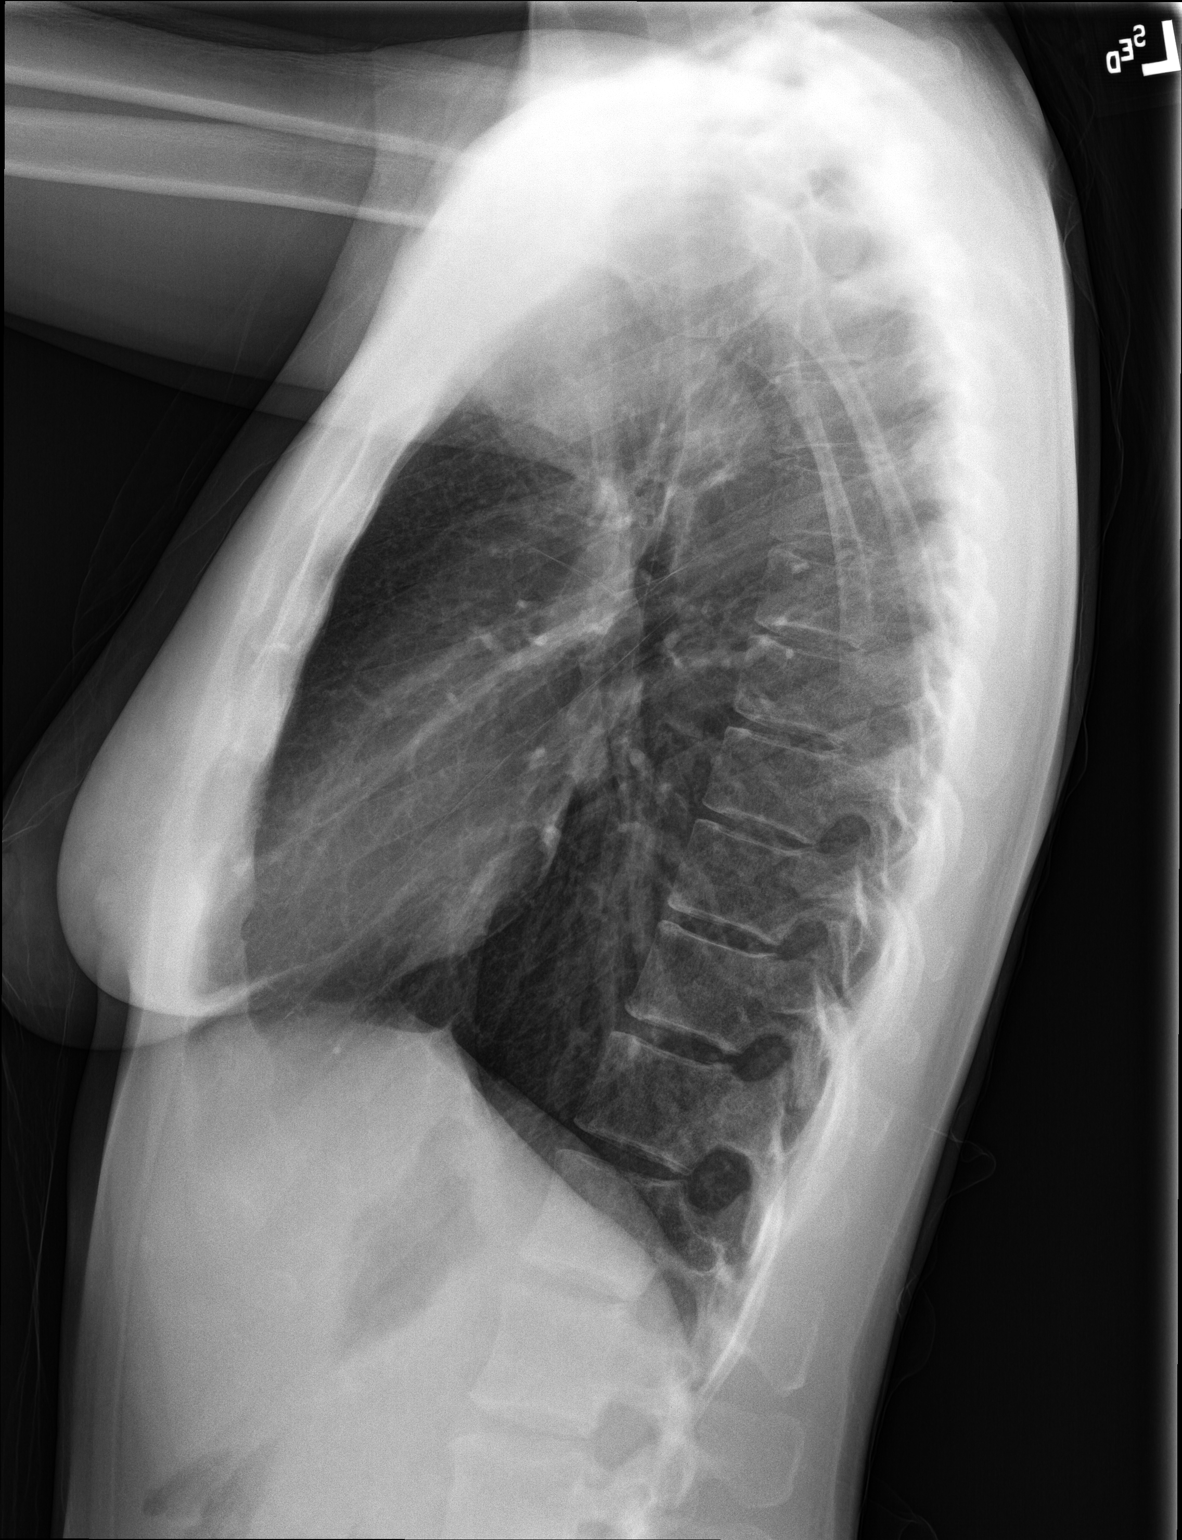

[2 of 2 positions shown; findings below may reference images not displayed]

FINDINGS: The heart size and mediastinal contours are within normal limits.
Both lungs are clear. The visualized skeletal structures are
unremarkable.
IMPRESSION: No active cardiopulmonary disease.

## 2023-08-01 ENCOUNTER — Other Ambulatory Visit: Payer: Self-pay | Admitting: Surgery

## 2023-08-01 DIAGNOSIS — Z1231 Encounter for screening mammogram for malignant neoplasm of breast: Secondary | ICD-10-CM

## 2023-08-11 ENCOUNTER — Ambulatory Visit
Admission: RE | Admit: 2023-08-11 | Discharge: 2023-08-11 | Disposition: A | Discharge status: Home / Self Care | Payer: 59 | Source: Ambulatory Visit | Attending: Surgery | Admitting: Surgery

## 2023-08-11 DIAGNOSIS — Z1231 Encounter for screening mammogram for malignant neoplasm of breast: Secondary | ICD-10-CM | POA: Diagnosis present

## 2023-08-15 ENCOUNTER — Other Ambulatory Visit: Payer: Self-pay | Admitting: Surgery

## 2023-08-15 DIAGNOSIS — R928 Other abnormal and inconclusive findings on diagnostic imaging of breast: Secondary | ICD-10-CM

## 2023-08-24 ENCOUNTER — Ambulatory Visit
Admission: RE | Admit: 2023-08-24 | Discharge: 2023-08-24 | Disposition: A | Discharge status: Home / Self Care | Payer: 59 | Source: Ambulatory Visit | Attending: Surgery | Admitting: Surgery

## 2023-08-24 DIAGNOSIS — R928 Other abnormal and inconclusive findings on diagnostic imaging of breast: Secondary | ICD-10-CM

## 2023-08-25 ENCOUNTER — Other Ambulatory Visit: Payer: Self-pay | Admitting: Surgery

## 2023-08-25 DIAGNOSIS — N63 Unspecified lump in unspecified breast: Secondary | ICD-10-CM

## 2023-08-25 DIAGNOSIS — R928 Other abnormal and inconclusive findings on diagnostic imaging of breast: Secondary | ICD-10-CM

## 2023-08-31 ENCOUNTER — Other Ambulatory Visit: Payer: 59

## 2023-09-01 ENCOUNTER — Ambulatory Visit
Admission: RE | Admit: 2023-09-01 | Discharge: 2023-09-01 | Disposition: A | Discharge status: Home / Self Care | Payer: 59 | Source: Ambulatory Visit | Attending: Surgery | Admitting: Surgery

## 2023-09-01 DIAGNOSIS — R928 Other abnormal and inconclusive findings on diagnostic imaging of breast: Secondary | ICD-10-CM | POA: Insufficient documentation

## 2023-09-01 DIAGNOSIS — N63 Unspecified lump in unspecified breast: Secondary | ICD-10-CM | POA: Insufficient documentation

## 2023-09-01 HISTORY — PX: BREAST BIOPSY: SHX20

## 2023-09-01 MED ORDER — LIDOCAINE HCL 1 % IJ SOLN
10.0000 mL | Freq: Once | INTRAMUSCULAR | Status: AC
  Administered 2023-09-01: 10 mL
  Filled 2023-09-01: qty 10

## 2023-09-02 LAB — SURGICAL PATHOLOGY

## 2024-06-25 ENCOUNTER — Other Ambulatory Visit: Payer: Self-pay | Admitting: Sports Medicine

## 2024-06-25 DIAGNOSIS — G8929 Other chronic pain: Secondary | ICD-10-CM

## 2024-06-25 DIAGNOSIS — M7542 Impingement syndrome of left shoulder: Secondary | ICD-10-CM

## 2024-06-25 DIAGNOSIS — M778 Other enthesopathies, not elsewhere classified: Secondary | ICD-10-CM

## 2024-06-25 DIAGNOSIS — M7552 Bursitis of left shoulder: Secondary | ICD-10-CM

## 2024-07-04 ENCOUNTER — Ambulatory Visit
Admission: RE | Admit: 2024-07-04 | Discharge: 2024-07-04 | Disposition: A | Discharge status: Home / Self Care | Source: Ambulatory Visit | Attending: Sports Medicine | Admitting: Sports Medicine

## 2024-07-04 DIAGNOSIS — M7552 Bursitis of left shoulder: Secondary | ICD-10-CM | POA: Diagnosis present

## 2024-07-04 DIAGNOSIS — M7542 Impingement syndrome of left shoulder: Secondary | ICD-10-CM | POA: Diagnosis present

## 2024-07-04 DIAGNOSIS — M25512 Pain in left shoulder: Secondary | ICD-10-CM | POA: Insufficient documentation

## 2024-07-04 DIAGNOSIS — G8929 Other chronic pain: Secondary | ICD-10-CM | POA: Insufficient documentation

## 2024-07-04 DIAGNOSIS — M778 Other enthesopathies, not elsewhere classified: Secondary | ICD-10-CM | POA: Insufficient documentation
